# Patient Record
Sex: Female | Born: 1990 | Race: White | Hispanic: No | Marital: Married | Smoking: Current every day smoker
Health system: Southern US, Community
[De-identification: ages and names within clinical notes are randomized; demographics above are authoritative.]

## PROBLEM LIST (undated history)

## (undated) DIAGNOSIS — D649 Anemia, unspecified: Secondary | ICD-10-CM

## (undated) DIAGNOSIS — F1910 Other psychoactive substance abuse, uncomplicated: Secondary | ICD-10-CM

## (undated) DIAGNOSIS — K219 Gastro-esophageal reflux disease without esophagitis: Secondary | ICD-10-CM

## (undated) HISTORY — PX: MOUTH SURGERY: SHX715

## (undated) HISTORY — PX: WISDOM TOOTH EXTRACTION: SHX21

---
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
## 2011-02-22 NOTE — Discharge Summary (Signed)
NAMESUZANN, Crystal Huang              ACCOUNT NO.:  [id number]   MEDICAL RECORD NO.:  [id number]          PATIENT TYPE:  INP   LOCATION:  9115                          [facility]   PHYSICIAN:  Hal Morales, M.D.DATE OF BIRTH:  24-Sep-1991   DATE OF ADMISSION:  09/22/2007  DATE OF DISCHARGE:  09/27/2007                               DISCHARGE SUMMARY   ADDENDUM:  She was deemed to have received the full benefit of her  hospital stay and was discharged home.   DISCHARGE MEDICATIONS:  1. Motrin 600 mg p.o. q.6 h p.r.n.  2. Procardia XL 60 mg one p.o. daily.   DISCHARGE LABS:  White blood cell count 13.8, hemoglobin 11.4, platelets  145.  Sodium 132, potassium 3.6, creatinine 0.68, AST 24, ALT 12.  Uric  acid 5.3.   DISCHARGE INSTRUCTIONS:  Per [facility] handout.   DISCHARGE FOLLOWUP:  Will occur with the home health nurse visiting next  week and again the following week and then in 6 weeks at our office or  p.r.n. as indicated.  Condition at discharge was good.      Marie L. Williams, C.N.M.      Hal Morales, M.D.  Electronically Signed    MLW/MEDQ  D:  09/27/2007  T:  09/28/2007  Job:  [id number]

## 2011-02-22 NOTE — Discharge Summary (Signed)
Huang, Crystal              ACCOUNT NO.:  [id number]   MEDICAL RECORD NO.:  [id number]          PATIENT TYPE:  INP   LOCATION:  9115                          [facility]   PHYSICIAN:  Hal Morales, M.D.DATE OF BIRTH:  Sep 14, 1991   DATE OF ADMISSION:  09/22/2007  DATE OF DISCHARGE:  09/27/2007                               DISCHARGE SUMMARY   ADMITTING PHYSICIAN:  Dois Davenport A. Rivard, M.D.   DISCHARGING PHYSICIAN:  Hal Morales, M.D.   ADMITTING DIAGNOSES:  Intrauterine pregnancy at 38-2/7 weeks and  preeclampsia.   DISCHARGE DIAGNOSES:  Intrauterine pregnancy at 38-2/7 weeks and  preeclampsia with the addition of status post spontaneous vaginal birth  of a female named Cayden.  Apgars 9 and 9, weighing 5 pounds 11 ounces  with bilateral vulvar lacerations.  Vaginal septum.   HOSPITAL PROCEDURES:  1. Electronic fetal monitoring.  2. Magnesium sulfate administration.  3. Induction of labor with Pitocin.  4. Spontaneous vaginal delivery.  5. Repair of vulvar lacerations.  6. Repair of vaginal septum.   HOSPITAL COURSE:  The patient was admitted with severe headaches and  hypertension.  Blood pressures were 156-184/112-113.  Uric acid was 5.6.  Urine showed greater than 300 mg of protein and platelets were 145.  Magnesium sulfate was initiated and labetalol was given for blood  pressure control.  The patient was begun on Pitocin for induction of  labor and progressed steadily throughout the day.  She was noted at the  time that she was beginning to push that she had a low vaginal septum  noted and the septum was removed and repaired.  She had a spontaneous  vaginal delivery of a female named Cayden.  Apgars 9 and 9, weighing 5  pounds 11 ounces.  Bilateral vulvar lacerations were repaired.  She was  taken to [facility] and given routine postpartum care.  Blood pressures ranged  122-155/77-100.  Labs were normal.  Magnesium sulfate was discontinued  on the second day.  Her  medication was changed to Procardia for better  blood pressure control.  She was transferred from AICU out to the floor.  Blood pressures ranged 120's to 150's over 70's to 90's.  On postpartum  day #3 her Procardia was increased to 60 mg daily.  Blood pressures then  improved to 130's over 80's to 90's.  On postpartum day #4 she was doing  well, ready to go home.  She was having no headaches or symptoms.  She  was breast-feeding well.  Blood pressures were  130-146/87-90.  Chest was clear.  Heart rate regular rate and rhythm.  Fundus firm.  Lochia small.  Extremities were without edema.  DTRs were  2+.  Perineum was healing.  She was deemed to have received full benefit  of her hospital stay and was discharged home.      Marie L. Williams, C.N.M.      Hal Morales, M.D.  Electronically Signed    MLW/MEDQ  D:  09/27/2007  T:  09/28/2007  Job:  [id number]

## 2011-02-22 NOTE — H&P (Signed)
NAMEARISSA, Crystal Huang              ACCOUNT NO.:  [id number]   MEDICAL RECORD NO.:  [id number]          PATIENT TYPE:  INP   LOCATION:  9373                          [facility]   PHYSICIAN:  Dois Davenport A. Rivard, M.D. DATE OF BIRTH:  24-May-1991   DATE OF ADMISSION:  09/22/2007  DATE OF DISCHARGE:                              HISTORY & PHYSICAL   HISTORY OF PRESENT ILLNESS:  Patient is a 20 year old single white  female gravida 2, para 0-0-1-0 who presents to maternity admissions at  38 weeks and 2 days with complaints of severe headache over the past 24  hours.  Patient reports her headache had been unrelieved by Tylox which  caused nausea and vomiting.  Patient denies any vision changes or right  upper quadrant pain.  Patient states that she feels that her face is  swollen but her edema in her lower extremities has remained unchanged.  Patient reports her fetus is moving normally and that she does have  occasional irregular uterine contractions, however, she denies rupture  of membranes or bleeding at the present time.   The patient's pregnancy has been remarkable for  1. Unsure LMP.  2. Tape sensitivity.  3. Toxo-risk.  4. Tobacco use which has ceased.  5. History of abnormal Pap.  6. Adolescent pregnancy.   Patient's pregnancy has been followed by the C.N.M. service of [facility].   ALLERGIES:  No medical allergies.  Patient is allergic to TAPE ADHESIVE.   GYNECOLOGICAL HISTORY:  Patient reports menarche at age 71 with 28-day  cycles and mild dysmenorrhea.  Patient with a history of abnormal Pap in  the past in 2007 which was to be repeated.  Patient's history is  remarkable for OCP use in the past and condoms most recently.  Patient  denies any history of STDs.   OBSTETRICAL HISTORY:  1. Pregnancy #10 May 2006, spontaneous abortion.  2. Pregnancy #2 is current.  Patient's LMP December 28, 2006.  [facility]      October 04, 2007, established by ultrasound.   PAST  MEDICAL HISTORY:  Significant for anemia resolved with iron,  otherwise negative.   PAST SURGICAL HISTORY:  Negative.   HOSPITALIZATION:  Significant for overdose of Tylenol which was not a  suicide attempt.   FAMILY HISTORY:  Patient's mother with heart disease, hypertension,  anemia, kidney disease.  Maternal grandfather with heart disease.  Maternal grandfather, hypertension.  Maternal grandmother, hypertension  as well as anemia and stroke.  Father with diabetes.  Father with  rheumatoid arthritis.  Paternal grandfather, lung cancer.  Grandmother  with DVT.  Father of the baby's parents are distant cousins.  Otherwise  family history is negative.   PRENATAL LABORATORY DATA:  Initial hemoglobin 12.3, hematocrit 37.4,  platelets 232,000.  Hepatitis B negative.  Rubella immune.  RPR  nonreactive.  Blood type O positive.  Antibody screen negative.  HIV  nonreactive.  Cystic fibrosis negative.  Glucola at 28 weeks 131 and RPR  nonreactive.  Group B Strep culture at 36 weeks negative.  Gonorrhea and  Chlamydia cultures negative.  Pap at  new OB negative.  Gonorrhea and  Chlamydia negative at new OB.   HISTORY OF PRESENT PREGNANCY:  Patient entered care at [redacted] weeks  gestation.  Patient treated with Phenergan for nausea and vomiting of  pregnancy.  Patient with nuchal thickness of 1.29 cm.  Nasal bone  present.  First trimester screen was negative.  Urine culture with E.  coli at the time of new OB which was treated with Macrobid.  Ultrasound  at 19 weeks consistent with dates.  Placenta anterior.  Cervix 4 cm,  normal fluid and declined maternal serum AFP.  Repeat culture for  __________  for UTI at 23 weeks revealed persistent E. coli which was  treated with Keflex.  Hemoglobin 9.8 at 26 weeks with above Glucola  results.  With no UTI signs and symptoms.  Patient was started on iron  at 26 weeks.  Patient with low back pain at 32 weeks.  Patient's urine  sent due to frequent UTIs.   Urine culture at 32 weeks returned with E.  coli again and patient was treated with Keflex.  Ultrasound size less  than dates at 35 weeks which revealed growth at the 72 percentile and  normal amniotic fluid index.  GBS, GC and Chlamydia were all obtained at  that time.  Cervix was also evaluated and was closed 50% at that time.  At 36 weeks, patient with complaint of headache not relieved by Tylenol  or Midrin unless given a prescription for Tylox for headaches.  At that  time, patient's blood pressure was 110/80 and trace of protein and  urine.  Patient also has been treated for a UTI and had a urine culture  showing E. coli on September 12, 2007, and was changed to a cephalosporin  at that time.   OBJECTIVE:  VITAL SIGNS:  Blood pressures on admission 175/115, 168/110,  163/113, 158/111.  Patient is afebrile, temperature 98.4 degrees.  SKIN:  Warm and dry.  Color satisfactory.  GENERAL APPEARANCE:  Patient is in no apparent distress but does appear  to be uncomfortable with headache.  HEENT:  Pupils are equal, round, reactive to light and accommodation.  CHEST:  Clear to auscultation and percussion.  CARDIOVASCULAR:  Regular rate and rhythm.  BREASTS:  Soft.  ABDOMEN:  Soft, gravid and nontender.  Fetus is vertex to Leopold's  maneuvers.  Fundal height consistent with 37 cm.  Fetal heart rate  baseline 110s-120s with variability and accelerations present.  Overall  reassuring fetal heart rate.  Occasional irregular uterine contraction  noted on fetal heart rate monitor.  STERILE VAGINAL:  Exam reveals cervix to be 1 cm dilated, 80% effaced, -  3 station, vertex presentation.  EXTREMITIES:  Trace edema noted in the lower extremities.  NEUROLOGIC:  Deep tendon reflexes 2+ with one beat of clonus on the  left, no clonus on the right.   ADMISSION LABORATORY DATA:  Platelet count 145,000, hemoglobin 12.3,  hematocrit 34.3.  Sodium 134, potassium 3.7.  SGOT 18, SGPT 13.  Uric  acid is  5.6.  Urine was greater than 300 mg/dl of protein on UA,  otherwise a negative urinalysis.   ASSESSMENT:  1. Intrauterine pregnancy at 38-2/7 weeks.  2. Preeclampsia.   PLAN:  1. Consult obtained with Dr. Estanislado Pandy.  Patient to be admitted to      birthing suite.  2. Care of patient to be transferred to the MD service at [facility].  3. Patient  to be given IV labetalol to decrease blood pressure in MAU      prior to transfer to birthing suite as well as started on magnesium      prophylaxis for preeclampsia.  Findings and diagnosis were      discussed with the patient and father of the baby.  Labor will be      induced with Cytotec beginning tonight per Dr. Estanislado Pandy.      Rhona Leavens, CNM      Crist Fat Rivard, M.D.  Electronically Signed    NOS/MEDQ  D:  09/23/2007  T:  09/24/2007  Job:  [id number]
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
## 2011-07-15 LAB — COMPREHENSIVE METABOLIC PANEL
Albumin: 2 — ABNORMAL LOW
Alkaline Phosphatase: 139 — ABNORMAL HIGH
BUN: 3 — ABNORMAL LOW
CO2: 25
Chloride: 106
GFR calc non Af Amer: >60
Glucose, Bld: 121 — ABNORMAL HIGH
Potassium: 4
Total Bilirubin: 0.2 — ABNORMAL LOW

## 2011-07-15 LAB — CBC
HCT: 27.8 — ABNORMAL LOW
Hemoglobin: 9.6 — ABNORMAL LOW
Platelets: 134 — ABNORMAL LOW
RBC: 3.13 — ABNORMAL LOW
WBC: 13.5 — ABNORMAL HIGH

## 2011-07-15 LAB — MAGNESIUM: Magnesium: 5.5 — ABNORMAL HIGH

## 2011-07-18 LAB — URINALYSIS, ROUTINE W REFLEX MICROSCOPIC
Leukocytes, UA: NEGATIVE
Nitrite: NEGATIVE
Protein, ur: >300 — AB
Urobilinogen, UA: 0.2

## 2011-07-18 LAB — COMPREHENSIVE METABOLIC PANEL
ALT: 15
AST: 18
AST: 24
AST: 27
Albumin: 2.5 — ABNORMAL LOW
Alkaline Phosphatase: 176 — ABNORMAL HIGH
BUN: 4 — ABNORMAL LOW
CO2: 21
CO2: 22
Calcium: 8.4
Chloride: 102
Creatinine, Ser: 0.67
Creatinine, Ser: 0.68
GFR calc Af Amer: >60
GFR calc Af Amer: >60
GFR calc Af Amer: >60
GFR calc non Af Amer: >60
GFR calc non Af Amer: >60
Glucose, Bld: 102 — ABNORMAL HIGH
Glucose, Bld: 110 — ABNORMAL HIGH
Potassium: 4.1
Sodium: 134 — ABNORMAL LOW
Total Bilirubin: 0.4
Total Protein: 5.6 — ABNORMAL LOW

## 2011-07-18 LAB — CBC
HCT: 32.7 — ABNORMAL LOW
Hemoglobin: 11.4 — ABNORMAL LOW
MCHC: 35.9
MCV: 85.5
MCV: 88.3
Platelets: 143 — ABNORMAL LOW
RBC: 4.01
RDW: 12.6
RDW: 12.7

## 2011-07-18 LAB — LACTATE DEHYDROGENASE
LDH: 140
LDH: 177

## 2011-07-18 LAB — URINE MICROSCOPIC-ADD ON

## 2013-02-21 ENCOUNTER — Other Ambulatory Visit: Payer: Self-pay

## 2013-02-26 ENCOUNTER — Other Ambulatory Visit (HOSPITAL_COMMUNITY): Payer: Self-pay | Admitting: Physician Assistant

## 2013-02-26 ENCOUNTER — Other Ambulatory Visit: Payer: Self-pay

## 2013-02-26 DIAGNOSIS — Z3689 Encounter for other specified antenatal screening: Secondary | ICD-10-CM

## 2013-02-26 DIAGNOSIS — O2692 Pregnancy related conditions, unspecified, second trimester: Secondary | ICD-10-CM

## 2013-02-27 ENCOUNTER — Encounter (HOSPITAL_COMMUNITY): Payer: Self-pay | Admitting: Physician Assistant

## 2013-03-06 ENCOUNTER — Ambulatory Visit (HOSPITAL_COMMUNITY)
Admission: RE | Admit: 2013-03-06 | Discharge: 2013-03-06 | Disposition: A | Discharge status: Home / Self Care | Payer: Medicaid Other | Source: Ambulatory Visit | Attending: Physician Assistant | Admitting: Physician Assistant

## 2013-03-06 ENCOUNTER — Encounter (HOSPITAL_COMMUNITY): Payer: Self-pay

## 2013-03-06 DIAGNOSIS — Z3689 Encounter for other specified antenatal screening: Secondary | ICD-10-CM

## 2013-03-06 DIAGNOSIS — F1120 Opioid dependence, uncomplicated: Secondary | ICD-10-CM | POA: Insufficient documentation

## 2013-03-06 DIAGNOSIS — Z1389 Encounter for screening for other disorder: Secondary | ICD-10-CM | POA: Insufficient documentation

## 2013-03-06 DIAGNOSIS — Z363 Encounter for antenatal screening for malformations: Secondary | ICD-10-CM | POA: Insufficient documentation

## 2013-03-06 DIAGNOSIS — O289 Unspecified abnormal findings on antenatal screening of mother: Secondary | ICD-10-CM | POA: Insufficient documentation

## 2013-03-06 DIAGNOSIS — F1920 Other psychoactive substance dependence, uncomplicated: Secondary | ICD-10-CM | POA: Insufficient documentation

## 2013-03-06 DIAGNOSIS — O2692 Pregnancy related conditions, unspecified, second trimester: Secondary | ICD-10-CM

## 2013-03-06 DIAGNOSIS — O99330 Smoking (tobacco) complicating pregnancy, unspecified trimester: Secondary | ICD-10-CM | POA: Insufficient documentation

## 2013-03-06 DIAGNOSIS — O358XX0 Maternal care for other (suspected) fetal abnormality and damage, not applicable or unspecified: Secondary | ICD-10-CM | POA: Insufficient documentation

## 2013-03-06 IMAGING — US US OB DETAIL+14 WK
1 series · 15 of 28 positions shown · non-contrast
Comparison: none

OBSTETRICS REPORT
                      (Signed Final 03/06/2013 [DATE])

Service(s) Provided
 US OB DETAIL + 14 WK                                  76811.0
Indications
 Detailed fetal anatomic survey
 Abnormal biochemical screen (quad) for Trisomy
 21 ([DATE])
 Suboxone use (8mg BID)
 Cigarette smoker
Fetal Evaluation
 Num Of Fetuses:    1
 Fetal Heart Rate:  138                          bpm
 Cardiac Activity:  Observed
 Presentation:      Transverse, head to
                    maternal right
 Placenta:          Anterior, above cervical os
 P. Cord            Visualized, central
 Insertion:
 Amniotic Fluid
 AFI FV:      Subjectively within normal limits
                                             Larg Pckt:     5.9  cm
Biometry
 BPD:     43.6  mm     G. Age:  19w 1d                CI:         84.5   70 - 86
 OFD:     51.6  mm                                    FL/HC:      15.9   15.8 -
                                                                         18
 HC:     154.2  mm     G. Age:  18w 3d       47  %    HC/AC:      1.22   1.07 -
 AC:       126  mm     G. Age:  18w 1d       44  %    FL/BPD:
 FL:      24.5  mm     G. Age:  17w 3d       16  %    FL/AC:      19.4   20 - 24
 HUM:     25.3  mm     G. Age:  18w 0d       44  %
 CER:       19  mm     G. Age:  18w 4d       57  %
 NFT:      4.5  mm
 Est. FW:     215  gm      0 lb 8 oz     39  %
Gestational Age
 LMP:           17w 2d        Date:  11/05/12                 EDD:   08/12/13
 U/S Today:     18w 2d                                        EDD:   08/05/13
 Best:          18w 2d     Det. By:  Early Ultrasound         EDD:   08/05/13
                                     (01/24/13)
Anatomy
 Cranium:          Appears normal         Aortic Arch:      Not well visualized
 Fetal Cavum:      Not well visualized    Ductal Arch:      Not well visualized
 Ventricles:       Appears normal         Diaphragm:        Appears normal
 Choroid Plexus:   Appears normal         Stomach:          Appears normal, left
                                                            sided
 Cerebellum:       Not well visualized    Abdomen:          Appears normal
 Posterior Fossa:  Not well visualized    Abdominal Wall:   Appears nml (cord
                                                            insert, abd wall)
 Nuchal Fold:      Not well visualized    Cord Vessels:     Appears normal (3
                                                            vessel cord)
 Face:             Appears normal         Kidneys:          Appear normal
                   (orbits and profile)
 Lips:             Appears normal         Bladder:          Appears normal
 Palate:           Not well visualized    Spine:            Not well visualized
 Heart:            Not well visualized    Lower             Appears normal
                                          Extremities:
 RVOT:             Not well visualized    Upper             Appears normal
 LVOT:             Not well visualized
 Other:  Fetus appears to be a male. Heels and 5th digit visualized. Nasal
         bone visualized. Technically difficult due to fetal position.
Targeted Anatomy
 Fetal Central Nervous System
 Cisterna Magna:
Cervix Uterus Adnexa
 Cervical Length:    3        cm
 Cervix:       Normal appearance by transabdominal scan.
 Left Ovary:    Within normal limits.
 Right Ovary:   Not visualized.
 Adnexa:     No abnormality visualized.
Impression
INDICATION: 23/22 yr old [id number] at 31w8d with abnormal
 quad screen with elevated inhibin of F.L33o3 and risk of
 trisomy 2[REDACTED] for fetal anatomic survey. Patient with
 opiate addiction on suboxone. Pateint reports history of
 preeclampsia.

[Series 1: us ob detail+14 wk · 0.19mm/px · 15 of 82 slices shown]
[im 1/82]
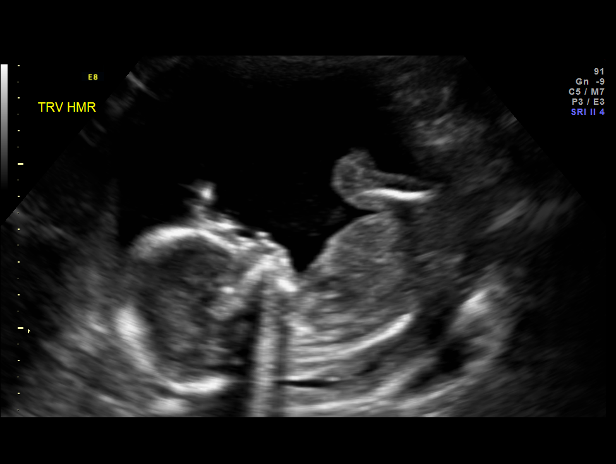
[im 7/82]
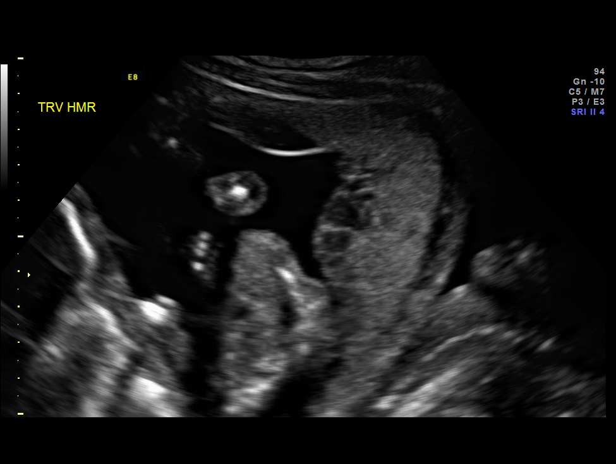
[im 13/82]
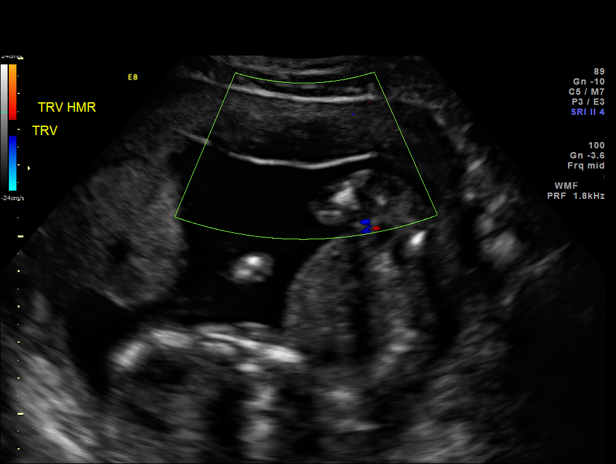
[im 19/82]
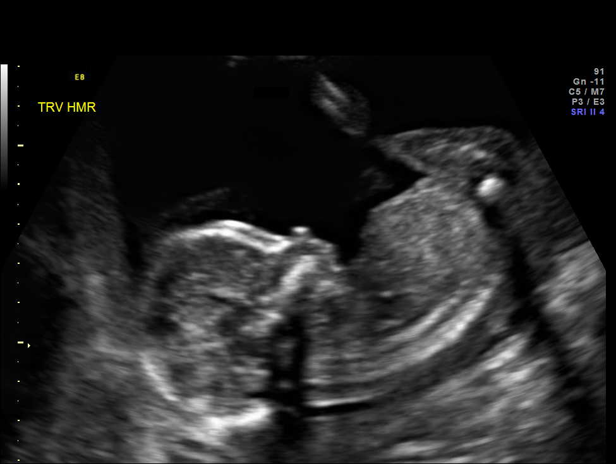
[im 25/82]
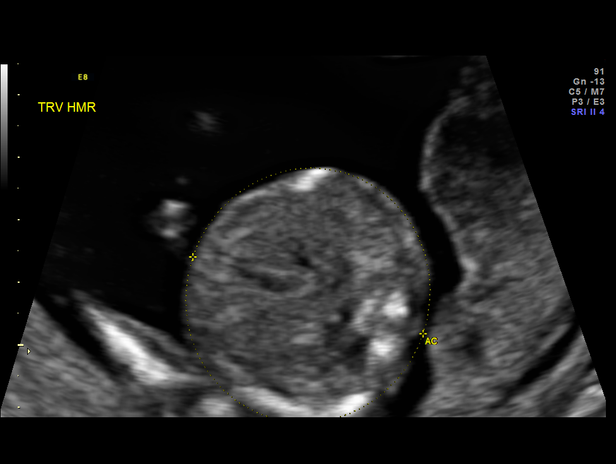
[im 31/82]
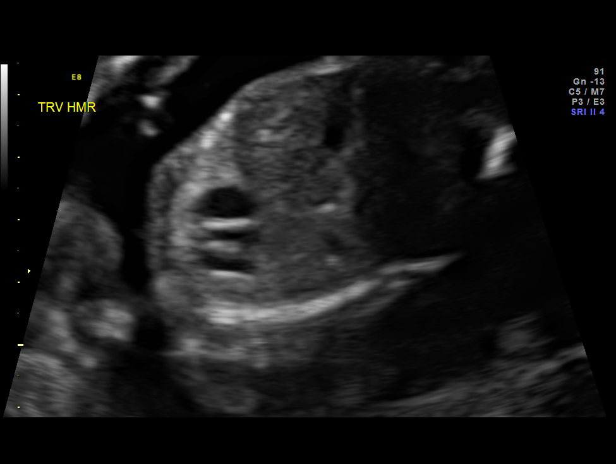
[im 37/82]
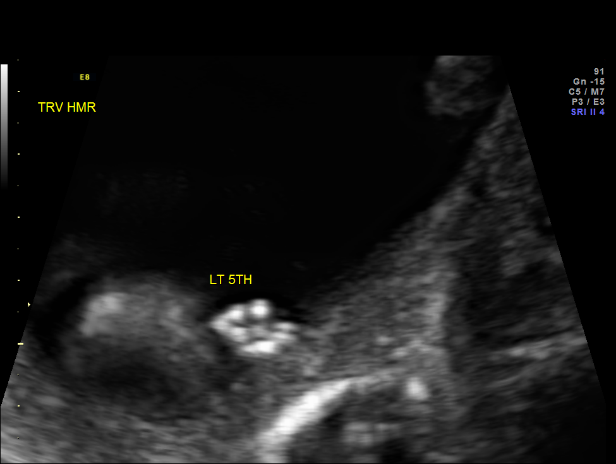
[im 43/82]
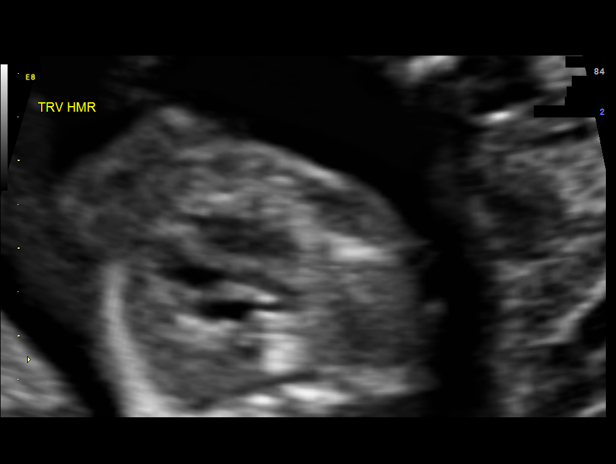
[im 46/82]
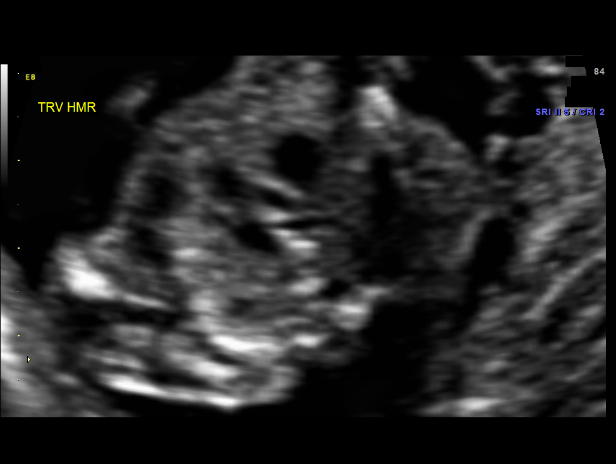
[im 52/82]
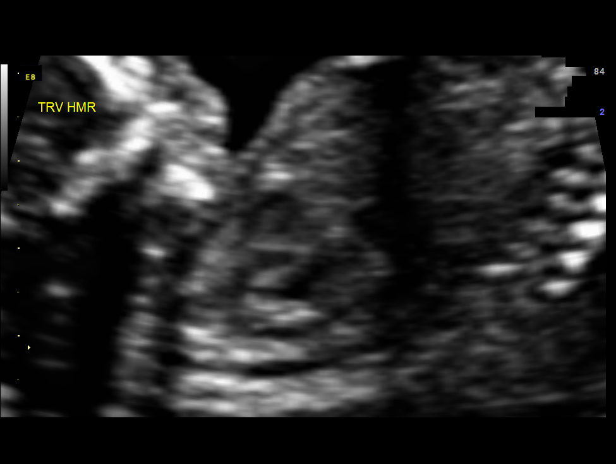
[im 58/82]
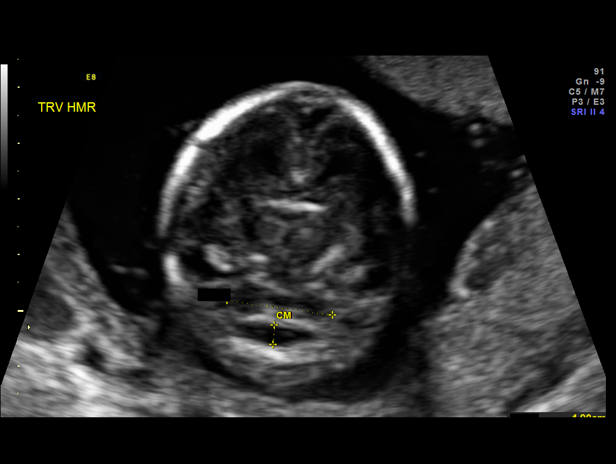
[im 64/82]
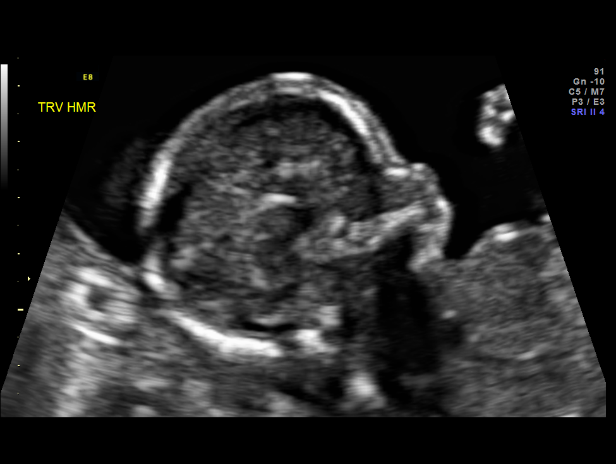
[im 70/82]
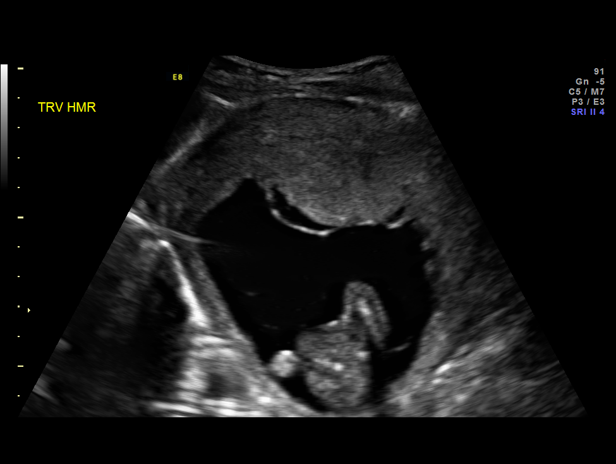
[im 76/82]
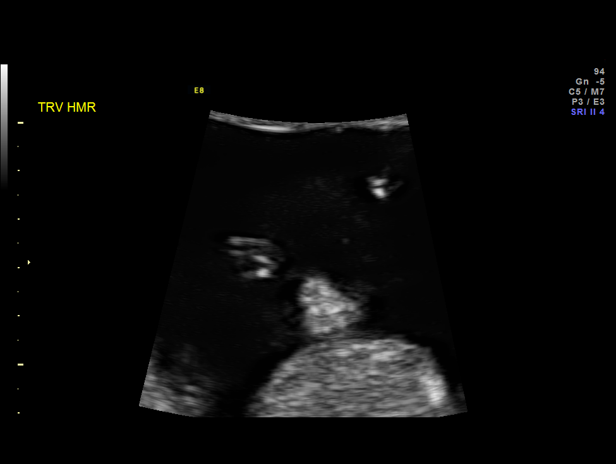
[im 82/82]
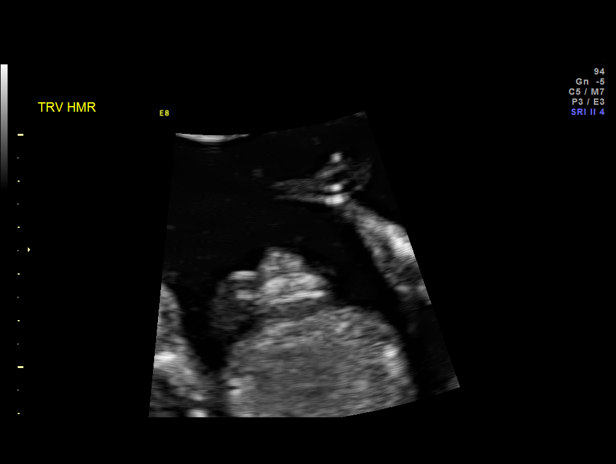

[15 of 28 positions shown; findings below may reference images not displayed]

FINDINGS: 1. Single intrauterine pregnancy.
 2. Fetal biometry is consistent with dating.
 3. Anterior placenta without evidence of previa.
 4. Normal amniotic fluid volume.
 5. Normal transabdominal cervical length.
 6. The views of the cavum, posterior fossa, nuchal fold,
 palate, heart, and spine are limited.
 7. The remainder of the limited anatomy survey is normal.
Recommendations

 1. Appropriate fetal growth.
 2. Limited anatomy survey:
 - recommend follow up in 2-3 weeks to complete anatomic
 survey
 3. Abnormal quad screen:
 - patient met with genetic counselor; see separate report
 - cell free fetal DNA pending
 4. Elevated inhibin:
 I discussed the association with fetal aneuploidy. I also
 discussed the association with increased risk of adverse
 pregnancy outcomes including increased risk of fetal growth
 restriction, preeclampsia, stillbirth, preterm birth, and
 abruption. I discussed these risks are only slightly increased
 over baseline but heightened surveillance is warranted. I
 recommend serial fetal growth ultrasounds. I recommend
 antenatal testing only in the setting of fetal growth restriction.
 I recommend close surveillance for the development of
 signs/symptoms of preterm labor, PPROM, and
 preeclampsia.
 5. Suboxone use:
 - no consult requested
 - recommend fetal growth every 4 weeks; given increased
 risk of fetal growth restriction
 - recommend NICU consult in the third trimester to discuss
 risk of neonatal abstinence syndrome
 6. History of preeclampsia:
 - recommend close surveillance for the development of
 signs/symptoms of preeclampsia
 - discussed increased risk of recurrence

 questions or concerns.

## 2013-03-06 NOTE — Progress Notes (Signed)
[facility] ultrasound  Indication: 23/22 yr old [id number] at [redacted]w[redacted]d with abnormal quad screen with elevated inhibin of 3. and risk of trisomy 103 of 1:117 for fetal anatomic survey. Patient with opiate addiction on suboxone. Pateint reports history of preeclampsia.  Findings: 1. Single intrauterine pregnancy. 2. Fetal biometry is consistent with dating. 3. Anterior placenta without evidence of previa. 4. Normal amniotic fluid volume. 5. Normal transabdominal cervical length. 6. The views of the cavum, posterior fossa, nuchal fold, palate, heart, and spine are limited. 7. The remainder of the limited anatomy survey is normal.  Recommendations: 1. Appropriate fetal growth. 2. Limited anatomy survey: - recommend follow up in 2-3 weeks to complete anatomic survey 3. Abnormal quad screen: - patient met with genetic counselor; see separate report - cell free fetal DNA pending 4. Elevated inhibin: I discussed the association with fetal aneuploidy. I also discussed the association with increased risk of adverse pregnancy outcomes including increased risk of fetal growth restriction, preeclampsia, stillbirth, preterm birth, and abruption. I discussed these risks are only slightly increased over baseline but heightened surveillance is warranted. I recommend serial fetal growth ultrasounds. I recommend antenatal testing only in the setting of fetal growth restriction. I recommend close surveillance for the development of signs/symptoms of preterm labor, PPROM, and preeclampsia.  5. Suboxone use: - no consult requested - recommend fetal growth every 4 weeks; given increased risk of fetal growth restriction - recommend NICU consult in the third trimester to discuss risk of neonatal abstinence syndrome 6. History of preeclampsia: - recommend close surveillance for the development of signs/symptoms of preeclampsia - discussed increased risk of recurrence  Eulis Foster, MD

## 2013-03-06 NOTE — Progress Notes (Addendum)
Genetic Counseling  High-Risk Gestation Note  Appointment Date:  03/06/2013 Referred By: Duncan Dull, PA-C Date of Birth:  1990-10-17 Partner: Marcial Pacas "Cindee Lame" Mckeone   Pregnancy History: [id number] Estimated Date of Delivery: 08/05/13 Estimated Gestational Age: [redacted]w[redacted]d Attending: Eulis Foster, MD   Ms. Julio Sicks and her partner, Mr. Tim "[employer], were seen for genetic counseling because of an increased risk for fetal Down syndrome based on maternal serum Quad screening through [facility].  They were counseled regarding the Quad screen result and the associated 1 in 117 risk for fetal Down syndrome.  We reviewed chromosomes, nondisjunction, and the common features and variable prognosis of Down syndrome.  In addition, we reviewed the screen adjusted reduction in risks for trisomy 18 and ONTDs.  We also discussed other explanations for a screen positive result including: a gestational dating error, differences in maternal metabolism, and normal variation. They understand that this screening is not diagnostic for Down syndrome but provides a risk assessment.  We reviewed available screening options including noninvasive prenatal screening (NIPS)/cell free fetal DNA (cffDNA) testing, and detailed ultrasound.  They were counseled that screening tests are used to modify a patient's a priori risk for aneuploidy, typically based on age. This estimate provides a pregnancy specific risk assessment. We reviewed the benefits and limitations of each option. Specifically, we discussed the conditions for which each test screens, the detection rates, and false positive rates of each.  They were also counseled regarding diagnostic testing via amniocentesis.  We reviewed the approximate 1 in 300-500 risk for complications for amniocentesis, including spontaneous pregnancy loss.  Ms. Yetta Flock reported that she had [facility] screening drawn through her primary obstetrician's office on Feb 26, 2013.  We reviewed that  this is a brand name for NIPS/cffDNA.  After reviewing this information, they elected to proceed with a detailed anatomy ultrasound and await the results of her NIPS/cffDNA test.  A complete ultrasound was performed today. The ultrasound report will be documented separately. There were no visualized fetal anomalies or markers suggestive of aneuploidy.  Some views were limited because of fetal positioning.  They understand that screening tests cannot rule out all birth defects or genetic syndromes. The patient was advised of this limitation and states she still does not want additional testing at this time.   Ms. Yetta Flock was provided with written information regarding cystic fibrosis (CF) including the carrier frequency and incidence in the Caucasian population, the availability of carrier testing and prenatal diagnosis if indicated.  In addition, we discussed that CF is routinely screened for as part of the [place] newborn screening panel.  She declined testing today.   Both family histories were reviewed and found to be contributory for the FOB, Mr. Corriveau, having significant ophthalmologic concerns.  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  He reported that he has never had good vision in his left eye, but was unclear about the specific etiology of his retinal difference.  He stated that his vision is reduced in his right eye as well, but the retina is normal.  Mr. Plake also reported that his father and his paternal grandfather have significant myopia, but neither have retinal detachments.  Mr. Adelstein does not have other medical concerns or congenital anomalies.  We discussed that retinal detachments can be genetic, environmental, or multifactorial in etiology.  Specifically, we discussed that retinal detachments can occur as a feature of an underlying genetic condition, specifically, connective tissue disorders.  He has never been evaluated by a  medical geneticist and has no other specific features  suggestive of a genetic condition.  We discussed that the risk of recurrence depends on the underlying etiology.    Additionally, Mr. Apt reported that his maternal second cousin has "some problems" of unknown etiology.  He is not clear what concerns this relative has, but thought that the relative may have some form of muscular dystrophy.  He was encouraged to discuss this history with his family and contact me with additional information.  We discussed that muscular dystrophy is a broad term describing multiple conditions, which can be inherited in a variety of ways.  Similarly, Ms. Yetta Flock reported that her maternal uncle was born with "multiple issues."  She was encouraged to contact me if she is able to learn more about this relative and his problems.  Without further information regarding the provided family history, an accurate genetic risk cannot be calculated. Further genetic counseling is warranted if more information is obtained.  Ms. Yetta Flock denied exposure to environmental toxins or chemical agents. She denied the use of alcohol and street drugs.  She reported that she smokes 1/2 ppd.  We discussed the implications and counseled regarding cessation.  She denied significant viral illnesses during the course of her pregnancy. Her medical and surgical histories were contributory for narcotic abuse and current use of suboxone.   I counseled this couple for approximately 44 minutes regarding the above risks and available options.     Donald Prose, MS Certified Genetic Counselor

## 2013-03-28 ENCOUNTER — Ambulatory Visit (HOSPITAL_COMMUNITY)
Admission: RE | Admit: 2013-03-28 | Discharge: 2013-03-28 | Disposition: A | Discharge status: Home / Self Care | Payer: Medicaid Other | Source: Ambulatory Visit | Attending: Physician Assistant | Admitting: Physician Assistant

## 2013-03-28 DIAGNOSIS — Z3689 Encounter for other specified antenatal screening: Secondary | ICD-10-CM | POA: Insufficient documentation

## 2013-03-28 DIAGNOSIS — O99320 Drug use complicating pregnancy, unspecified trimester: Secondary | ICD-10-CM | POA: Insufficient documentation

## 2013-03-28 DIAGNOSIS — F1920 Other psychoactive substance dependence, uncomplicated: Secondary | ICD-10-CM | POA: Insufficient documentation

## 2013-03-28 DIAGNOSIS — O289 Unspecified abnormal findings on antenatal screening of mother: Secondary | ICD-10-CM | POA: Insufficient documentation

## 2013-03-28 DIAGNOSIS — O09299 Supervision of pregnancy with other poor reproductive or obstetric history, unspecified trimester: Secondary | ICD-10-CM | POA: Insufficient documentation

## 2013-03-28 DIAGNOSIS — F1120 Opioid dependence, uncomplicated: Secondary | ICD-10-CM | POA: Insufficient documentation

## 2013-03-28 DIAGNOSIS — Z1389 Encounter for screening for other disorder: Secondary | ICD-10-CM | POA: Insufficient documentation

## 2013-03-28 DIAGNOSIS — Z363 Encounter for antenatal screening for malformations: Secondary | ICD-10-CM | POA: Insufficient documentation

## 2013-03-28 DIAGNOSIS — O358XX0 Maternal care for other (suspected) fetal abnormality and damage, not applicable or unspecified: Secondary | ICD-10-CM | POA: Insufficient documentation

## 2013-03-28 DIAGNOSIS — O99330 Smoking (tobacco) complicating pregnancy, unspecified trimester: Secondary | ICD-10-CM | POA: Insufficient documentation

## 2013-03-28 IMAGING — US US OB FOLLOW-UP
1 series · 12 of 28 positions shown · non-contrast
Comparison: none

[Series 1: us ob follow-up · 0.16mm/px · 12 of 86 slices shown]
[im 4/86]
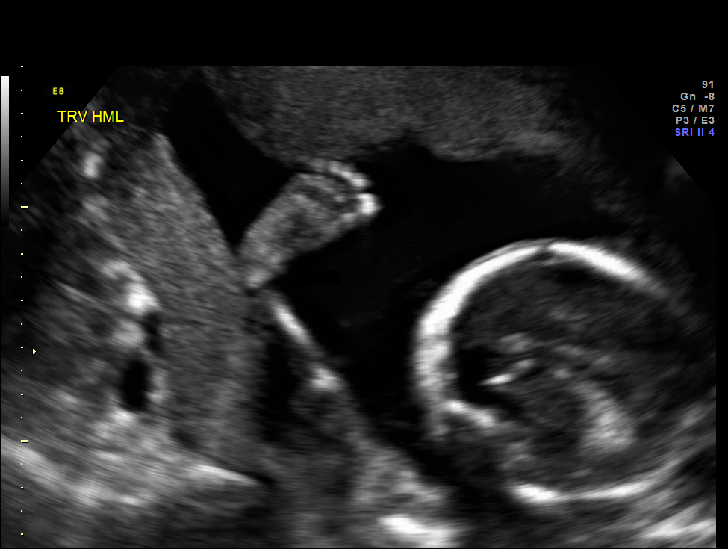
[im 10/86]
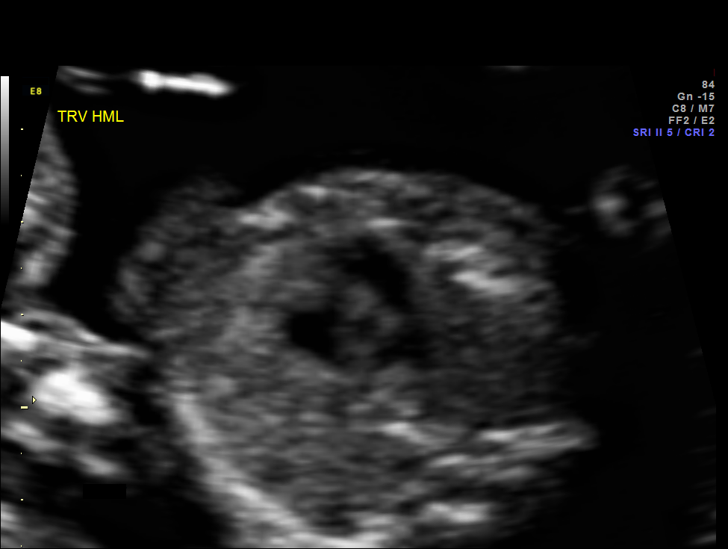
[im 16/86]
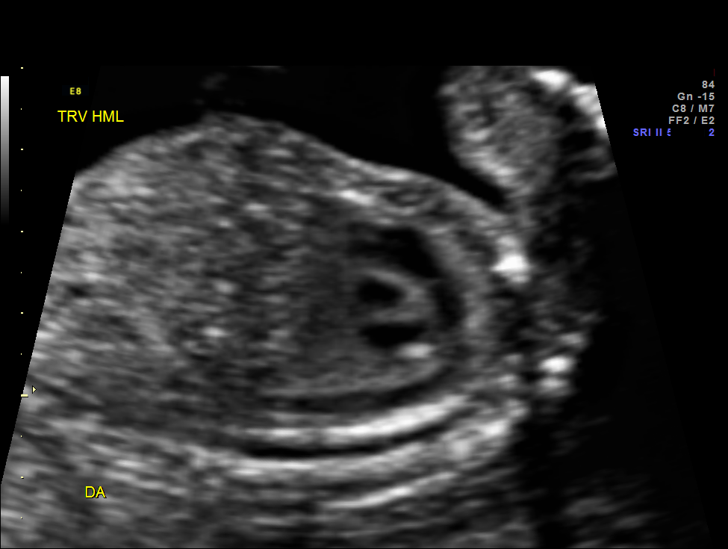
[im 26/86]
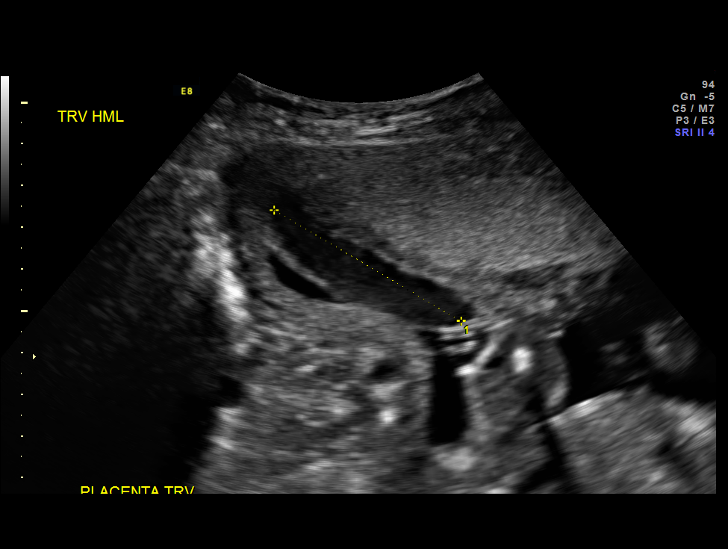
[im 32/86]
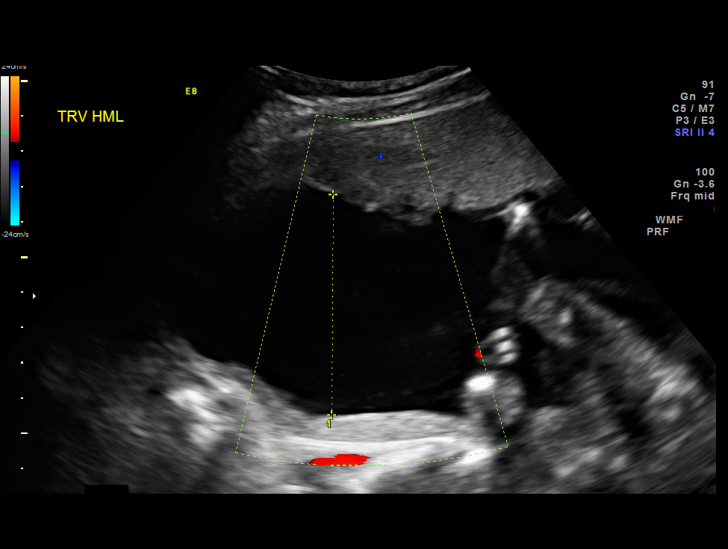
[im 38/86]
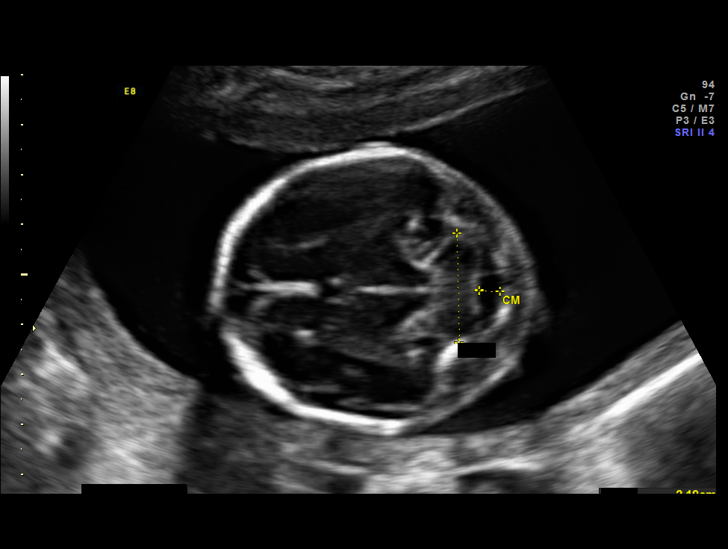
[im 48/86]
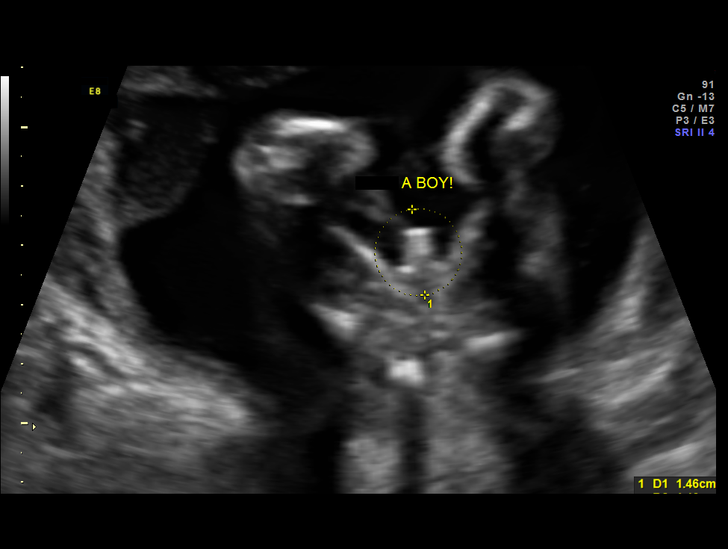
[im 54/86]
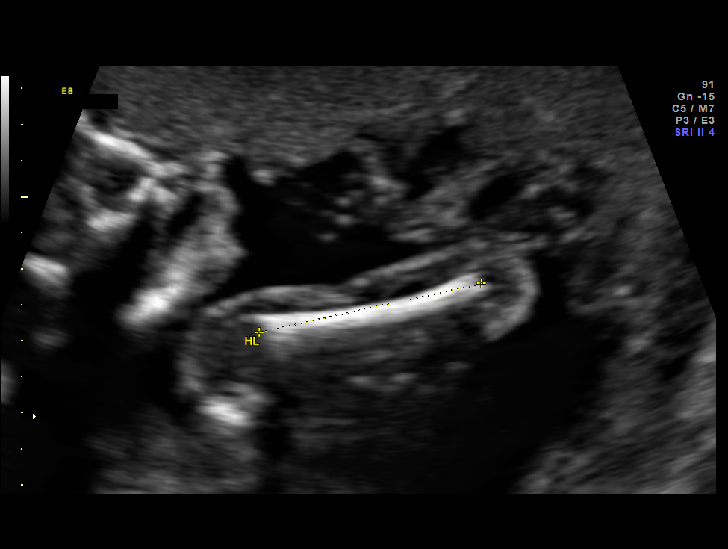
[im 60/86]
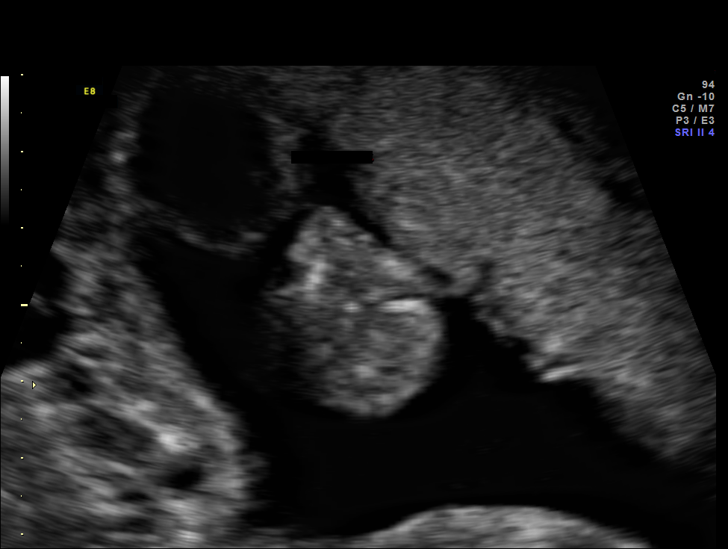
[im 70/86]
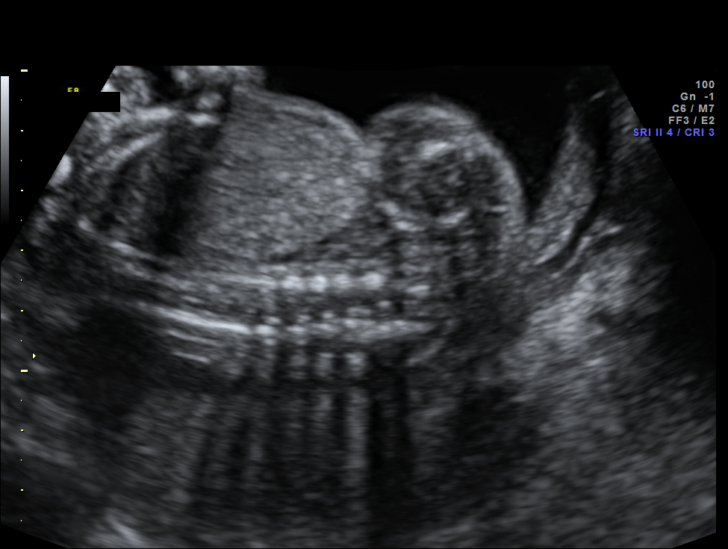
[im 76/86]
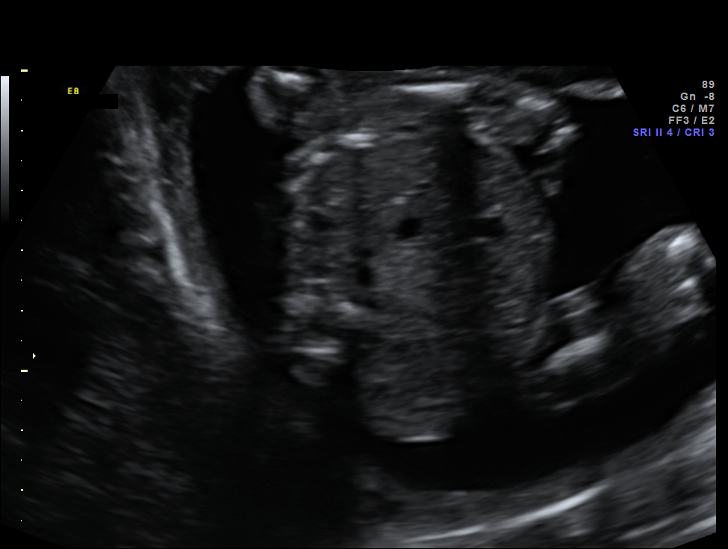
[im 82/86]
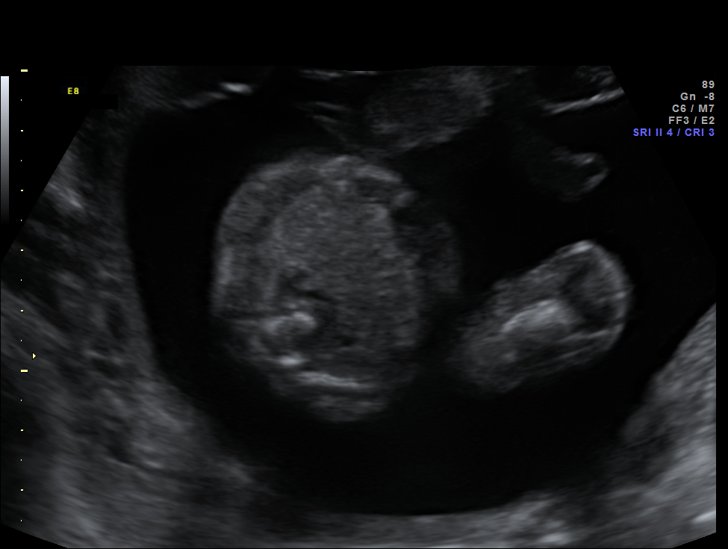

[12 of 28 positions shown; findings below may reference images not displayed]

OBSTETRICS REPORT
                      (Signed Final 03/28/2013 [DATE])

Service(s) Provided

 US OB FOLLOW UP                                       76816.1
Indications

 Elevated Inhibin (3.21 MoM)
 Abnormal biochemical screen (quad) for Trisomy
 21 ([DATE]) - low risk NIPS
 Suboxone use (8mg BID)
 Cigarette smoker
 Poor obstetric history: Previous preeclampsia
 Follow-up incomplete fetal anatomic evaluation
Fetal Evaluation

 Num Of Fetuses:    1
 Fetal Heart Rate:  135                          bpm
 Cardiac Activity:  Observed
 Presentation:      Variable
 Placenta:          Anterior, above cervical os
 P. Cord            Previously Visualized
 Insertion:

 Amniotic Fluid
 AFI FV:      Subjectively within normal limits
                                             Larg Pckt:     6.2  cm
Biometry

 BPD:     54.2  mm     G. Age:  22w 3d                CI:         85.1   70 - 86
 OFD:     63.7  mm                                    FL/HC:      16.9   15.9 -

 HC:     188.3  mm     G. Age:  21w 1d       27  %    HC/AC:      1.19   1.06 -

 AC:     158.8  mm     G. Age:  21w 0d       30  %    FL/BPD:
 FL:      31.8  mm     G. Age:  19w 6d        6  %    FL/AC:      20.0   20 - 24
 HUM:     31.7  mm     G. Age:  20w 4d       26  %
 CER:     21.9  mm     G. Age:  20w 6d       32  %

 Est. FW:     367  gm    0 lb 13 oz      26  %
Gestational Age

 LMP:           20w 3d        Date:  11/05/12                 EDD:   08/12/13
 U/S Today:     21w 1d                                        EDD:   08/07/13
 Best:          21w 3d     Det. By:  Early Ultrasound         EDD:   08/05/13
                                     (01/24/13)
Anatomy

 Cranium:          Appears normal         Aortic Arch:      Appears normal
 Fetal Cavum:      Appears normal         Ductal Arch:      Appears normal
 Ventricles:       Appears normal         Diaphragm:        Previously seen
 Choroid Plexus:   Appears normal         Stomach:          Appears normal, left
                                                            sided
 Cerebellum:       Appears normal         Abdomen:          Appears normal
 Posterior Fossa:  Appears normal         Abdominal Wall:   Previously seen
 Nuchal Fold:      Not applicable (>20    Cord Vessels:     Previously seen
                   wks GA)
 Face:             Orbits and profile     Kidneys:          Appear normal
                   previously seen
 Lips:             Appears normal         Bladder:          Appears normal
 Palate:           Appears normal         Spine:            Appears normal
 Heart:            Appears normal         Lower             Previously seen
                   (4CH, axis, and        Extremities:
                   situs)
 RVOT:             Appears normal         Upper             Previously seen
                                          Extremities:
 LVOT:             Appears normal

 Other:  Fetus appears to be a male. Heels and 5th digit previously visualized.
         Nasal bone previously visualized. Technically difficult due to fetal
         position.
Targeted Anatomy

 Fetal Central Nervous System
 Cisterna Magna:
Cervix Uterus Adnexa

 Cervical Length:    3        cm

 Cervix:       Normal appearance by transabdominal scan.

 Adnexa:     No abnormality visualized.
Impression

 IUP at 21+3 weeks
 Normal interval anatomy; anatomic survey complete
 Normal amniotic fluid volume
 Appropriate interval growth with EFW at the 26th %tile
Recommendations

 Serial ultrasounds for growth every 4-6 weeks

 questions or concerns.

## 2014-01-09 ENCOUNTER — Encounter (HOSPITAL_COMMUNITY): Payer: Self-pay | Admitting: *Deleted

## 2014-08-11 ENCOUNTER — Encounter (HOSPITAL_COMMUNITY): Payer: Self-pay | Admitting: *Deleted

## 2014-10-28 LAB — OB RESULTS CONSOLE RUBELLA ANTIBODY, IGM: RUBELLA: IMMUNE

## 2014-10-28 LAB — OB RESULTS CONSOLE RPR: RPR: NONREACTIVE

## 2014-10-28 LAB — OB RESULTS CONSOLE HGB/HCT, BLOOD
HCT: 39 %
HEMOGLOBIN: 12.9 g/dL

## 2014-10-28 LAB — OB RESULTS CONSOLE TSH: TSH: 1.1

## 2014-10-28 LAB — OB RESULTS CONSOLE GC/CHLAMYDIA
Chlamydia: NEGATIVE
Gonorrhea: NEGATIVE

## 2014-10-28 LAB — OB RESULTS CONSOLE VARICELLA ZOSTER ANTIBODY, IGG: VARICELLA IGG: IMMUNE

## 2014-10-28 LAB — OB RESULTS CONSOLE HIV ANTIBODY (ROUTINE TESTING): HIV: NONREACTIVE

## 2014-10-28 LAB — OB RESULTS CONSOLE ANTIBODY SCREEN: Antibody Screen: NEGATIVE

## 2014-10-28 LAB — OB RESULTS CONSOLE PLATELET COUNT: PLATELETS: 202 10*3/uL

## 2014-10-28 LAB — OB RESULTS CONSOLE HEPATITIS B SURFACE ANTIGEN: Hepatitis B Surface Ag: NEGATIVE

## 2014-10-28 LAB — OB RESULTS CONSOLE ABO/RH: RH Type: POSITIVE

## 2014-11-05 LAB — CYTOLOGY - PAP: Pap: NEGATIVE

## 2014-12-04 ENCOUNTER — Other Ambulatory Visit (HOSPITAL_COMMUNITY): Payer: Self-pay | Admitting: Obstetrics and Gynecology

## 2014-12-04 ENCOUNTER — Encounter (HOSPITAL_COMMUNITY): Payer: Self-pay | Admitting: Obstetrics and Gynecology

## 2014-12-04 DIAGNOSIS — Z3A18 18 weeks gestation of pregnancy: Secondary | ICD-10-CM

## 2014-12-04 DIAGNOSIS — O280 Abnormal hematological finding on antenatal screening of mother: Secondary | ICD-10-CM

## 2014-12-04 DIAGNOSIS — Z3689 Encounter for other specified antenatal screening: Secondary | ICD-10-CM

## 2014-12-04 DIAGNOSIS — O2622 Pregnancy care for patient with recurrent pregnancy loss, second trimester: Secondary | ICD-10-CM

## 2014-12-23 ENCOUNTER — Other Ambulatory Visit (HOSPITAL_COMMUNITY): Payer: Self-pay | Admitting: Obstetrics and Gynecology

## 2014-12-23 ENCOUNTER — Encounter (HOSPITAL_COMMUNITY): Payer: Self-pay

## 2014-12-23 ENCOUNTER — Ambulatory Visit (HOSPITAL_COMMUNITY)
Admission: RE | Admit: 2014-12-23 | Discharge: 2014-12-23 | Disposition: A | Discharge status: Home / Self Care | Payer: Medicaid Other | Source: Ambulatory Visit | Attending: Obstetrics and Gynecology | Admitting: Obstetrics and Gynecology

## 2014-12-23 ENCOUNTER — Encounter (HOSPITAL_COMMUNITY): Payer: Self-pay | Admitting: *Deleted

## 2014-12-23 DIAGNOSIS — Z3A16 16 weeks gestation of pregnancy: Secondary | ICD-10-CM | POA: Diagnosis not present

## 2014-12-23 DIAGNOSIS — Z3A18 18 weeks gestation of pregnancy: Secondary | ICD-10-CM

## 2014-12-23 DIAGNOSIS — F1910 Other psychoactive substance abuse, uncomplicated: Secondary | ICD-10-CM

## 2014-12-23 DIAGNOSIS — Z315 Encounter for genetic counseling: Secondary | ICD-10-CM | POA: Insufficient documentation

## 2014-12-23 DIAGNOSIS — O280 Abnormal hematological finding on antenatal screening of mother: Secondary | ICD-10-CM | POA: Insufficient documentation

## 2014-12-23 DIAGNOSIS — Z3689 Encounter for other specified antenatal screening: Secondary | ICD-10-CM

## 2014-12-23 DIAGNOSIS — O351XX0 Maternal care for (suspected) chromosomal abnormality in fetus, not applicable or unspecified: Secondary | ICD-10-CM | POA: Diagnosis not present

## 2014-12-23 DIAGNOSIS — O99322 Drug use complicating pregnancy, second trimester: Secondary | ICD-10-CM | POA: Insufficient documentation

## 2014-12-23 DIAGNOSIS — O2622 Pregnancy care for patient with recurrent pregnancy loss, second trimester: Secondary | ICD-10-CM

## 2014-12-23 HISTORY — DX: Other psychoactive substance abuse, uncomplicated: F19.10

## 2014-12-23 IMAGING — US US OB DETAIL+14 WK
1 series · 12 of 28 positions shown · non-contrast
Comparison: none

[Series 1: us ob detail+14 wk · 0.18mm/px · 12 of 83 slices shown]
[im 4/83]
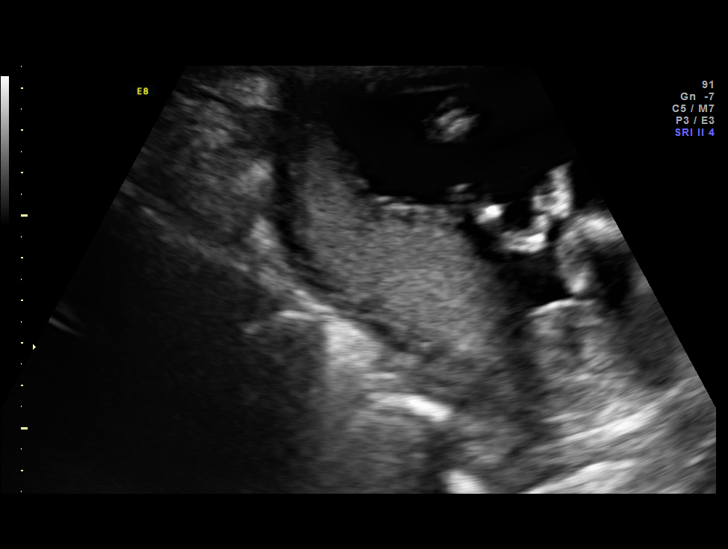
[im 10/83]
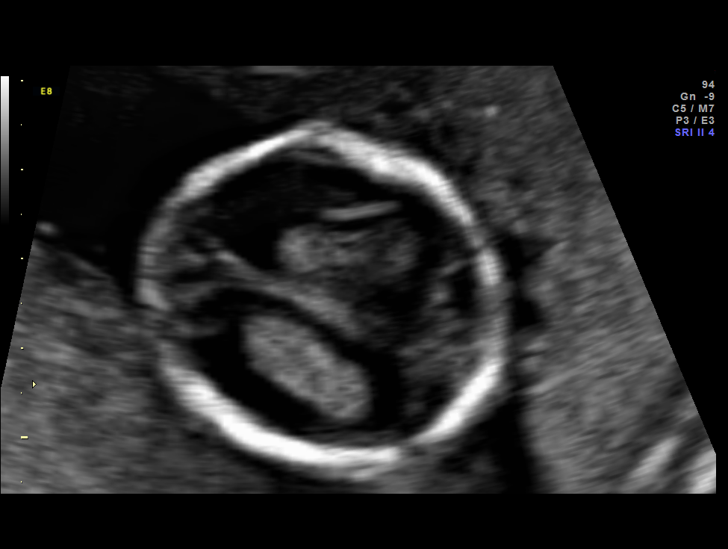
[im 16/83]
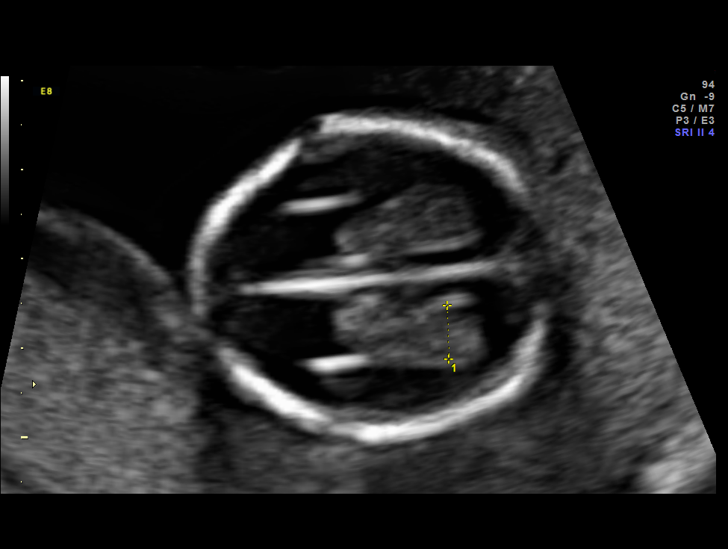
[im 25/83]
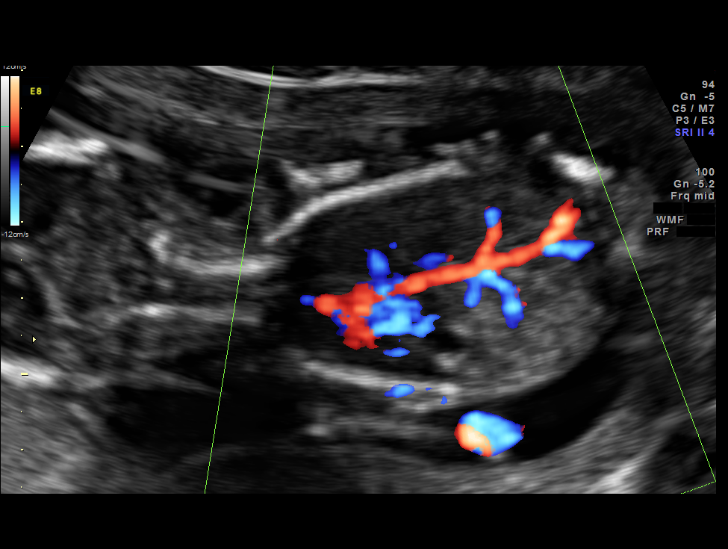
[im 31/83]
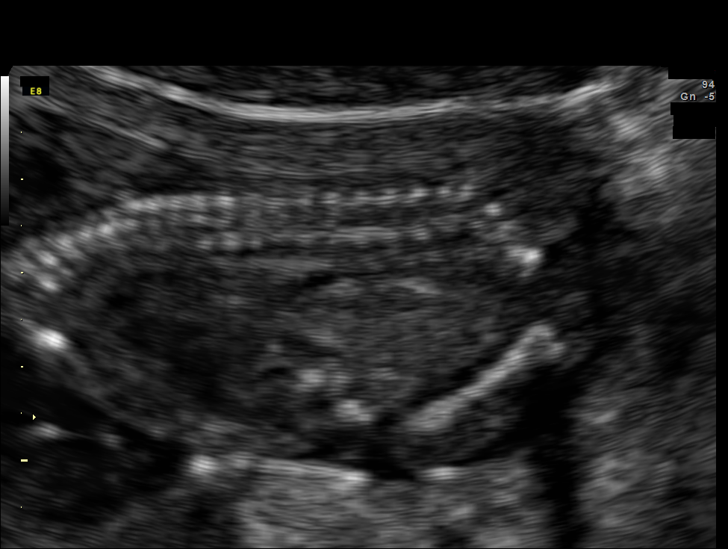
[im 37/83]
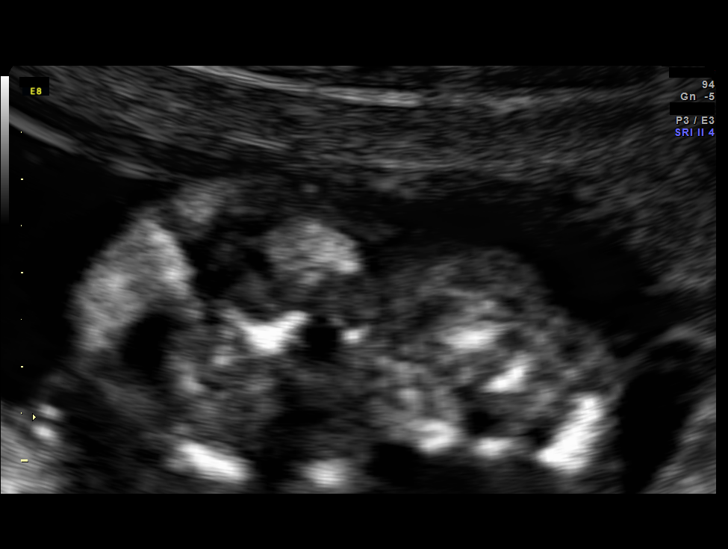
[im 46/83]
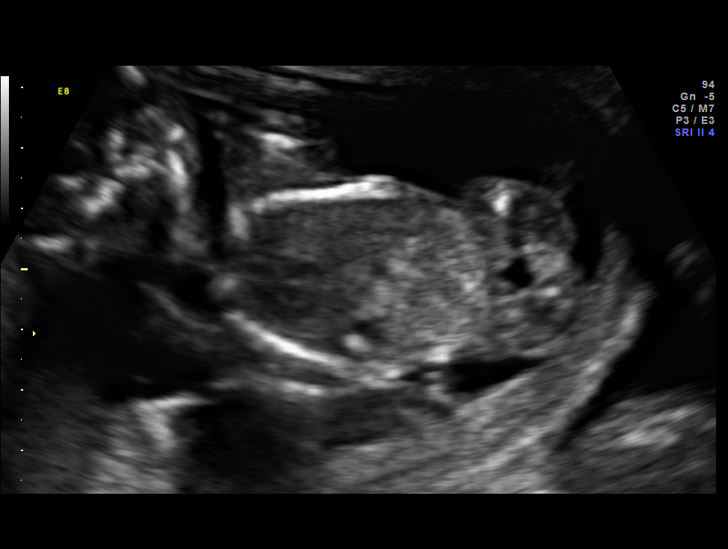
[im 52/83]
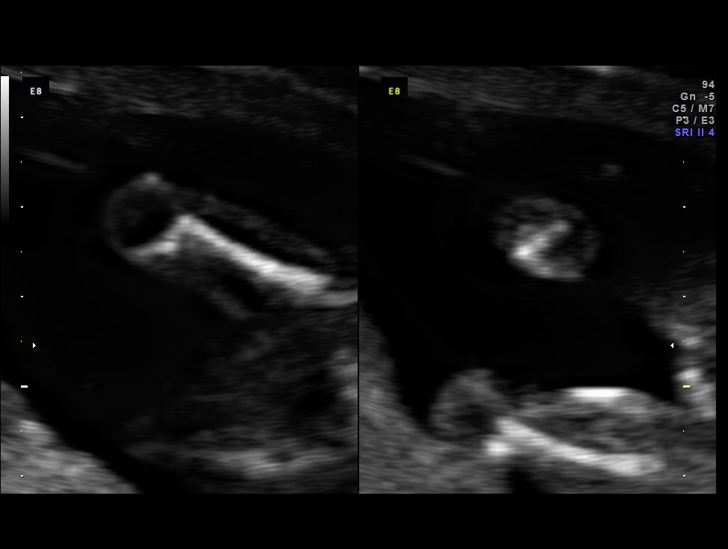
[im 58/83]
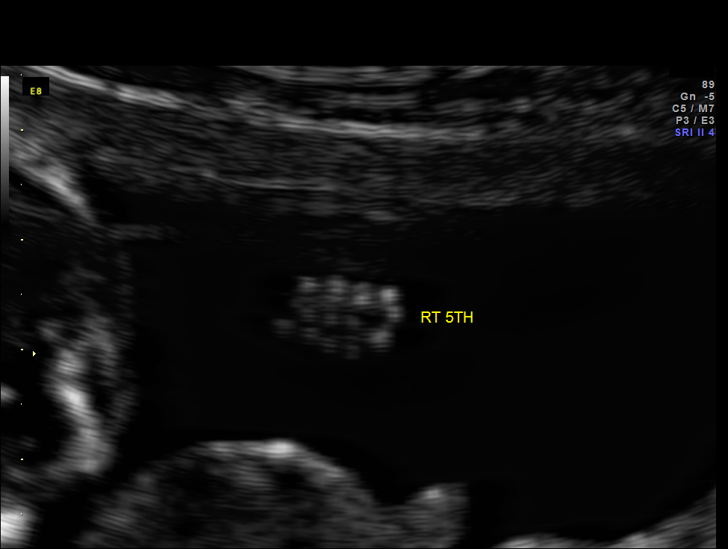
[im 67/83]
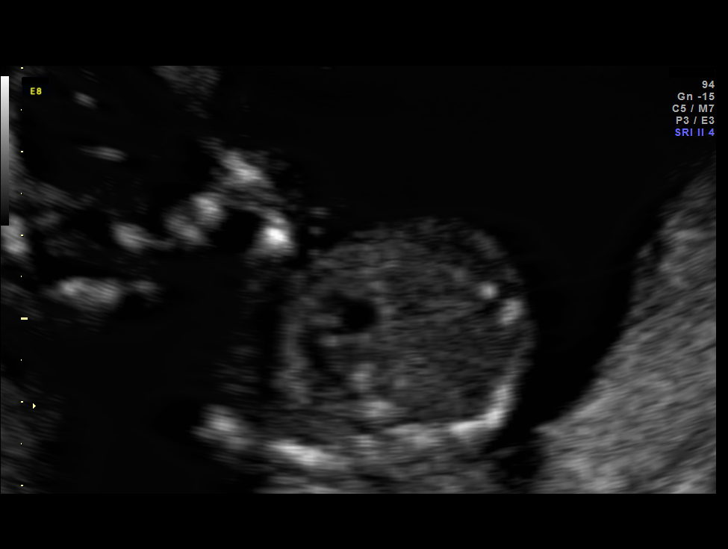
[im 73/83]
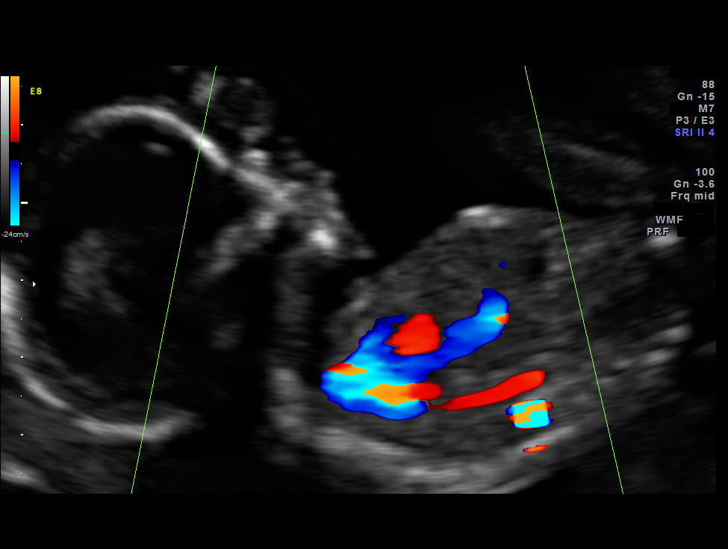
[im 79/83]
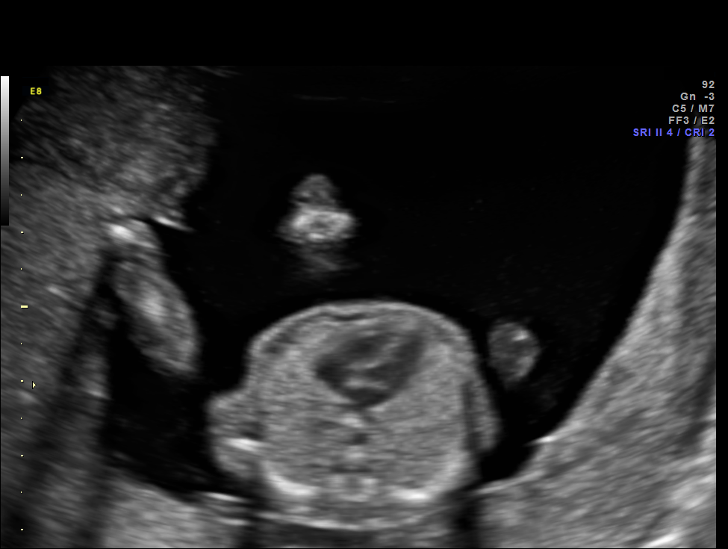

[12 of 28 positions shown; findings below may reference images not displayed]

OBSTETRICS REPORT
                      (Signed Final 12/23/2014 [DATE])

Service(s) Provided

 US OB DETAIL + 14 WK                                  76811.0
Indications

 Abnormal biochemical screen (quad) for Trisomy
 21 ([DATE], low risk panorama in office)
 Detailed fetal anatomic survey                        Z36
 High risk pregnancy due to maternal drug abuse

 16 weeks gestation of pregnancy
Fetal Evaluation

 Num Of Fetuses:    1
 Fetal Heart Rate:  144                          bpm
 Cardiac Activity:  Observed
 Presentation:      Variable
 Placenta:          Posterior Previa
 P. Cord            Visualized, central
 Insertion:

 Amniotic Fluid
 AFI FV:      Subjectively within normal limits
                                             Larg Pckt:     3.2  cm
Biometry

 BPD:     36.1  mm     G. Age:  17w 0d                CI:         80.0   70 - 86
 OFD:     45.1  mm                                    FL/HC:      14.9   14.6 -

 HC:     131.2  mm     G. Age:  16w 5d       32  %    HC/AC:      1.21   1.07 -

 AC:     108.1  mm     G. Age:  16w 5d       44  %    FL/BPD:
 FL:      19.5  mm     G. Age:  15w 6d       11  %    FL/AC:      18.0   20 - 24
 HUM:     20.3  mm     G. Age:  16w 0d       34  %
 CER:     16.5  mm     G. Age:  16w 4d       42  %
 Est. FW:     153  gm      0 lb 5 oz     46  %
Gestational Age

 LMP:           15w 0d        Date:  09/09/14                 EDD:   06/16/15
 U/S Today:     16w 4d                                        EDD:   06/05/15
 Best:          16w 6d     Det. By:  Early Ultrasound         EDD:   06/03/15
                                     (11/04/14)
Anatomy

 Cranium:          Appears normal         Aortic Arch:      Appears normal
 Fetal Cavum:      Not well visualized    Ductal Arch:      Appears normal
 Ventricles:       Appears normal         Diaphragm:        Appears normal
 Choroid Plexus:   Appears normal         Stomach:          Appears normal, left
                                                            sided
 Cerebellum:       Appears normal         Abdomen:          Appears normal
 Posterior Fossa:  Appears normal         Abdominal Wall:   Appears nml (cord
                                                            insert, abd wall)
 Nuchal Fold:      Appears normal         Cord Vessels:     Appears normal (3
                                                            vessel cord)
 Face:             Appears normal         Kidneys:          Appear normal
                   (orbits and profile)
 Lips:             Appears normal         Bladder:          Appears normal
 Heart:            Appears normal         Spine:            Appears normal
                   (4CH, axis, and
                   situs)
 RVOT:             Appears normal         Lower             Appears normal
                                          Extremities:
 LVOT:             Appears normal         Upper             Appears normal
                                          Extremities:

 Other:  Fetus appears to be a female. Heels and 5th digit visualized. Nasal
         bone visualized. Technically difficult due to fetal position.
Cervix Uterus Adnexa

 Cervical Length:    3.1      cm

 Cervix:       Normal appearance by transabdominal scan.
Impression

 SIUP at 16+6 weeks
 Normal detailed fetal anatomy; limited views of CSP
 Markers of aneuploidy: none
 Normal amniotic fluid volume
 Measurements consistent with early US
 Complete posterior previa
Recommendations

 Follow-up ultrasound in 4-6 weeks to reassess anatomy and
 placental location

 questions or concerns.

## 2014-12-23 NOTE — Progress Notes (Signed)
Genetic Counseling  High-Risk Gestation Note  Appointment Date:  12/23/2014 Referred By: Crystal Huang, Dionne, MD Date of Birth:  03-25-1991   Pregnancy History: [id number] Estimated Date of Delivery: 06/03/15 Estimated Gestational Age: 8645w6d Attending: Particia NearingMartha Decker, MD   Mrs. Crystal HousemanCaitlin Huang was seen for genetic counseling because of an increased risk for fetal Down syndrome based on first trimester screening through [facility]. [employer] Genetic Counseling Intern, Raeford RazorErica Cousins, assisted with genetic counseling under my direct supervision. Mrs. Crystal Huang was seen for genetic counseling in a previous pregnancy on 03/06/2013.   She was counseled regarding the First trimester screen result and the associated 1 in 120 risk for fetal Down syndrome.  We reviewed chromosomes, nondisjunction, and the common features and variable prognosis of Down syndrome.  In addition, we reviewed the screen adjusted reduction in risks for trisomy 3118.  We also discussed other explanations for a screen positive result including: differences in maternal metabolism and normal variation. She understands that this screening is not diagnostic for Down syndrome but provides a risk assessment.  We reviewed available screening options including noninvasive prenatal screening (NIPS)/cell free fetal DNA (cffDNA) testing, and detailed ultrasound.  She was counseled that screening tests are used to modify a patient's a priori risk for aneuploidy, typically based on age. This estimate provides a pregnancy specific risk assessment. We reviewed the benefits and limitations of each option.Specifically, we discussed the conditions for which each test screens, the detection rates, and false positive rates of each.   Ms. Crystal Huang previously had NIPS ([facility]) facilitated through her OB office which was within normal range for the conditions screened. We reviewed these results with the patient, which showed less than 1 in 10,000 risk  for trisomies 21, 18 and 13, and monosomy X (Turner syndrome).  In addition, the risk for triploidy/vanishing twin and sex chromosome trisomies (47,XXX and 47,XXY) was also low risk.  Analysis for 22q11 deletion syndrome was also low risk (1 in 13,300).  We reviewed that this testing identifies > 99% of pregnancies with trisomy 3721, trisomy 913, sex chromosome trisomies (47,XXX and 47,XXY), and triploidy. The detection rate for trisomy 18 is 96%.  The detection rate for monosomy X is ~92%.  The false positive rate is <0.1% for all conditions. Testing was also consistent with female fetal sex.  The patient did wish to know fetal sex.  She understands that this testing does not identify all genetic conditions and is not diagnostic for these chromosome conditions.   She was also counseled regarding diagnostic testing via amniocentesis. We reviewed the approximate 1 in 300-500 risk for complications for amniocentesis, including spontaneous pregnancy loss. After consideration of all the options, she elected to proceed with detailed ultrasound only. A complete ultrasound was performed today. The ultrasound report will be sent under separate cover. Amniocentesis was declined today. She understands that screening tests cannot rule out all birth defects or genetic syndromes. The patient was advised of this limitation and states she still does not want additional testing at this time. Follow-up ultrasound was planned for 01/20/15.   Ms. Crystal Huang was provided with written information regarding cystic fibrosis (CF) including the carrier frequency and incidence in the Caucasian population, the availability of carrier testing and prenatal diagnosis if indicated.  In addition, we discussed that CF is routinely screened for as part of the [facility] newborn screening panel.  She declined testing today.   Both family histories were reviewed and found to be contributory for a nephew to the father  of the pregnancy with seizures and another  nephew (brothers to each other) with speech delay. The underlying etiologies are not known, and both individuals are reportedly undergoing evaluations in an attempt to determine the cause. Epilepsy occurs in approximately 1% of the population and can have many causes.  Approximately 80% of epilepsy is thought to be idiopathic while the remaining 20% is secondary to a variety of factors such as perinatal events, infections, trauma and genetic disease.  A specific diagnosis in an affected individual is necessary to accurately assess the risk for other family members to develop epilepsy.  In the absence of a known etiology, epilepsy is thought to be caused by a combination of genetic and environmental factors, called multifactorial inheritance. There are also various causes for speech delay. Additional information is needed to accurately assess recurrence risk for relatives.   The remaining family history was remarkable for vision problems for the father of the pregnancy. This was previously discussed during the patient's genetic counseling visit in 2014. The patient reported that their son, currently 41 months old, was recently diagnosed with extreme far-sightedness and has to wear glasses. We discussed that additional information is needed regarding the specific etiology for vision concerns in the family. However, recurrence risk could be up to 50% in the case of an underlying autosomal dominant cause. The patient may contact us if additional information is obtained regarding this family history. Without further information regarding the provided family history, an accurate genetic risk cannot be calculated. Further genetic counseling is warranted if more information is obtained.  Crystal Huang denied exposure to environmental toxins or chemical agents. She denied the use of alcohol. She reported smoking approximately 3 cigarettes per day. The associations of smoking in pregnancy were reviewed and cessation  encouraged. 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   I counseled Mrs. Crystal Huang for approximately 45 minutes regarding the above risks and available options.   Quinn Plowman, MS,  Certified Genetic Counselor  12/23/2014

## 2014-12-24 ENCOUNTER — Other Ambulatory Visit (HOSPITAL_COMMUNITY): Payer: Self-pay | Admitting: Obstetrics and Gynecology

## 2014-12-24 ENCOUNTER — Other Ambulatory Visit (HOSPITAL_COMMUNITY): Payer: Self-pay | Admitting: Maternal and Fetal Medicine

## 2014-12-24 DIAGNOSIS — O99322 Drug use complicating pregnancy, second trimester: Secondary | ICD-10-CM

## 2014-12-24 DIAGNOSIS — F1910 Other psychoactive substance abuse, uncomplicated: Secondary | ICD-10-CM

## 2014-12-24 DIAGNOSIS — O289 Unspecified abnormal findings on antenatal screening of mother: Secondary | ICD-10-CM

## 2015-01-02 ENCOUNTER — Encounter (HOSPITAL_COMMUNITY): Payer: Self-pay

## 2015-01-02 ENCOUNTER — Inpatient Hospital Stay (HOSPITAL_COMMUNITY): Admission: RE | Admit: 2015-01-02 | Payer: Self-pay | Source: Ambulatory Visit

## 2015-01-20 ENCOUNTER — Encounter (HOSPITAL_COMMUNITY): Payer: Self-pay

## 2015-01-20 ENCOUNTER — Other Ambulatory Visit (HOSPITAL_COMMUNITY): Payer: Self-pay | Admitting: Maternal and Fetal Medicine

## 2015-01-20 ENCOUNTER — Ambulatory Visit (HOSPITAL_COMMUNITY)
Admission: RE | Admit: 2015-01-20 | Discharge: 2015-01-20 | Disposition: A | Discharge status: Home / Self Care | Payer: Medicaid Other | Source: Ambulatory Visit | Attending: Obstetrics and Gynecology | Admitting: Obstetrics and Gynecology

## 2015-01-20 DIAGNOSIS — Z36 Encounter for antenatal screening of mother: Secondary | ICD-10-CM | POA: Insufficient documentation

## 2015-01-20 DIAGNOSIS — O283 Abnormal ultrasonic finding on antenatal screening of mother: Secondary | ICD-10-CM | POA: Insufficient documentation

## 2015-01-20 DIAGNOSIS — IMO0002 Reserved for concepts with insufficient information to code with codable children: Secondary | ICD-10-CM | POA: Insufficient documentation

## 2015-01-20 DIAGNOSIS — F1910 Other psychoactive substance abuse, uncomplicated: Secondary | ICD-10-CM

## 2015-01-20 DIAGNOSIS — Z3A20 20 weeks gestation of pregnancy: Secondary | ICD-10-CM | POA: Insufficient documentation

## 2015-01-20 DIAGNOSIS — O99322 Drug use complicating pregnancy, second trimester: Secondary | ICD-10-CM

## 2015-01-20 DIAGNOSIS — Z0489 Encounter for examination and observation for other specified reasons: Secondary | ICD-10-CM | POA: Insufficient documentation

## 2015-01-20 DIAGNOSIS — O289 Unspecified abnormal findings on antenatal screening of mother: Secondary | ICD-10-CM

## 2015-03-03 ENCOUNTER — Other Ambulatory Visit (HOSPITAL_COMMUNITY): Payer: Self-pay | Admitting: Maternal and Fetal Medicine

## 2015-03-03 ENCOUNTER — Ambulatory Visit (HOSPITAL_COMMUNITY)
Admission: RE | Admit: 2015-03-03 | Discharge: 2015-03-03 | Disposition: A | Discharge status: Home / Self Care | Payer: Medicaid Other | Source: Ambulatory Visit | Attending: Obstetrics and Gynecology | Admitting: Obstetrics and Gynecology

## 2015-03-03 DIAGNOSIS — F1990 Other psychoactive substance use, unspecified, uncomplicated: Secondary | ICD-10-CM | POA: Diagnosis present

## 2015-03-03 DIAGNOSIS — F1910 Other psychoactive substance abuse, uncomplicated: Secondary | ICD-10-CM

## 2015-03-03 DIAGNOSIS — O99322 Drug use complicating pregnancy, second trimester: Secondary | ICD-10-CM | POA: Insufficient documentation

## 2015-03-03 DIAGNOSIS — Z36 Encounter for antenatal screening of mother: Secondary | ICD-10-CM | POA: Insufficient documentation

## 2015-03-03 DIAGNOSIS — O4400 Placenta previa specified as without hemorrhage, unspecified trimester: Secondary | ICD-10-CM | POA: Insufficient documentation

## 2015-03-03 DIAGNOSIS — Z3A26 26 weeks gestation of pregnancy: Secondary | ICD-10-CM | POA: Diagnosis not present

## 2015-03-03 DIAGNOSIS — O09299 Supervision of pregnancy with other poor reproductive or obstetric history, unspecified trimester: Secondary | ICD-10-CM | POA: Diagnosis not present

## 2015-03-03 DIAGNOSIS — O289 Unspecified abnormal findings on antenatal screening of mother: Secondary | ICD-10-CM

## 2015-04-01 ENCOUNTER — Other Ambulatory Visit (HOSPITAL_COMMUNITY): Payer: Self-pay | Admitting: Maternal and Fetal Medicine

## 2015-04-01 ENCOUNTER — Ambulatory Visit (HOSPITAL_COMMUNITY)
Admission: RE | Admit: 2015-04-01 | Discharge: 2015-04-01 | Disposition: A | Discharge status: Home / Self Care | Payer: Medicaid Other | Source: Ambulatory Visit | Attending: Obstetrics and Gynecology | Admitting: Obstetrics and Gynecology

## 2015-04-01 ENCOUNTER — Encounter (HOSPITAL_COMMUNITY): Payer: Self-pay

## 2015-04-01 DIAGNOSIS — Z3A31 31 weeks gestation of pregnancy: Secondary | ICD-10-CM | POA: Insufficient documentation

## 2015-04-01 DIAGNOSIS — O289 Unspecified abnormal findings on antenatal screening of mother: Secondary | ICD-10-CM

## 2015-04-01 DIAGNOSIS — O99322 Drug use complicating pregnancy, second trimester: Secondary | ICD-10-CM

## 2015-04-01 DIAGNOSIS — O09293 Supervision of pregnancy with other poor reproductive or obstetric history, third trimester: Secondary | ICD-10-CM | POA: Insufficient documentation

## 2015-04-01 DIAGNOSIS — F1910 Other psychoactive substance abuse, uncomplicated: Secondary | ICD-10-CM

## 2015-04-01 DIAGNOSIS — O4403 Placenta previa specified as without hemorrhage, third trimester: Secondary | ICD-10-CM | POA: Insufficient documentation

## 2015-04-01 DIAGNOSIS — O283 Abnormal ultrasonic finding on antenatal screening of mother: Secondary | ICD-10-CM | POA: Diagnosis not present

## 2015-04-01 IMAGING — US US UA CORD DOPPLER
2 series · 12 of 28 positions shown · non-contrast
Comparison: none

[Series 1: us ob follow up · 12 acquisitions, 2 frames shown (1 of 2)]
[im 3/12]
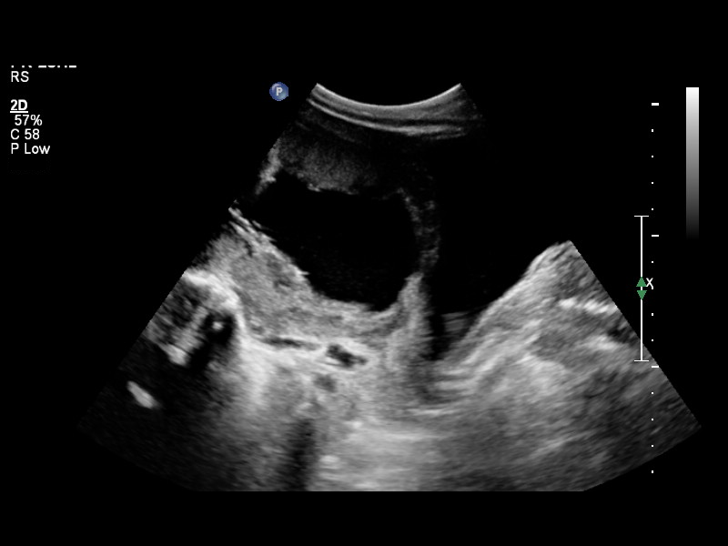
[im 9/12]
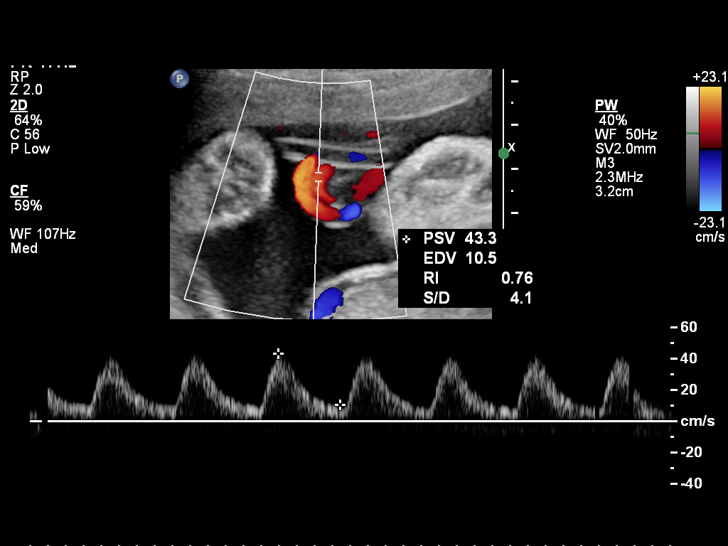

[Series 1: us ob follow up · 10 of 60 slices shown (2 of 2)]
[im 1/60]
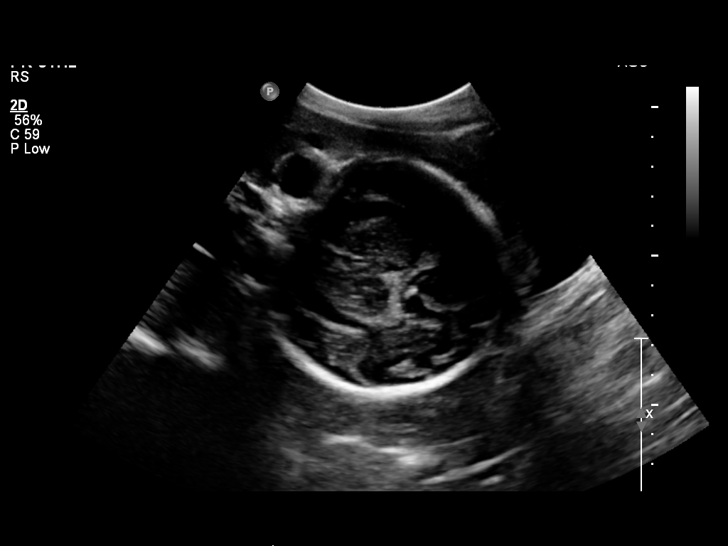
[im 9/60]
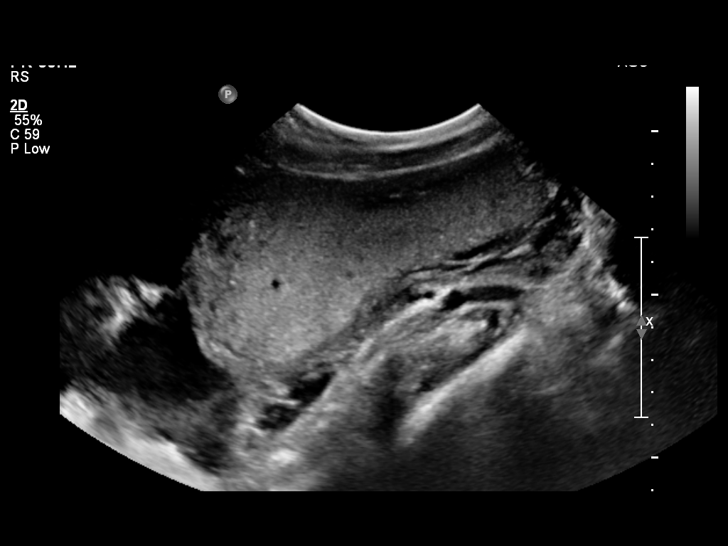
[im 14/60]
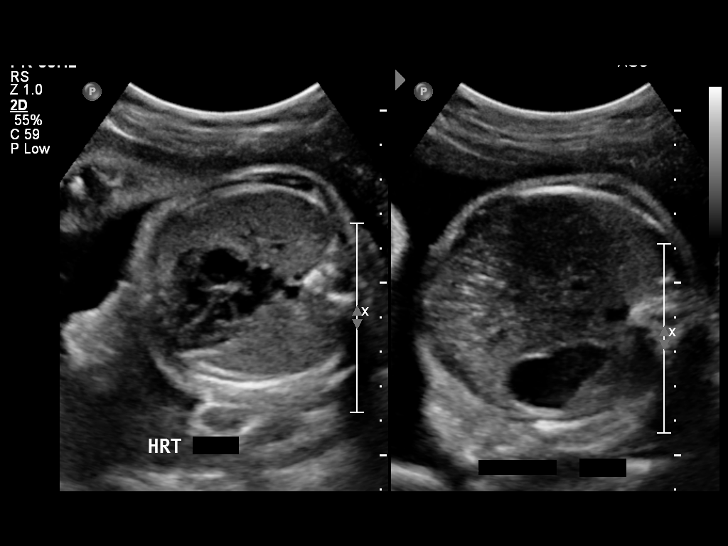
[im 19/60]
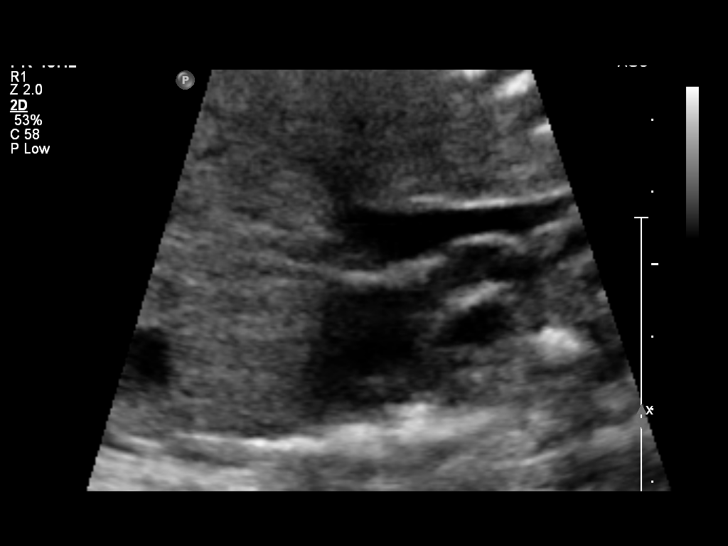
[im 27/60]
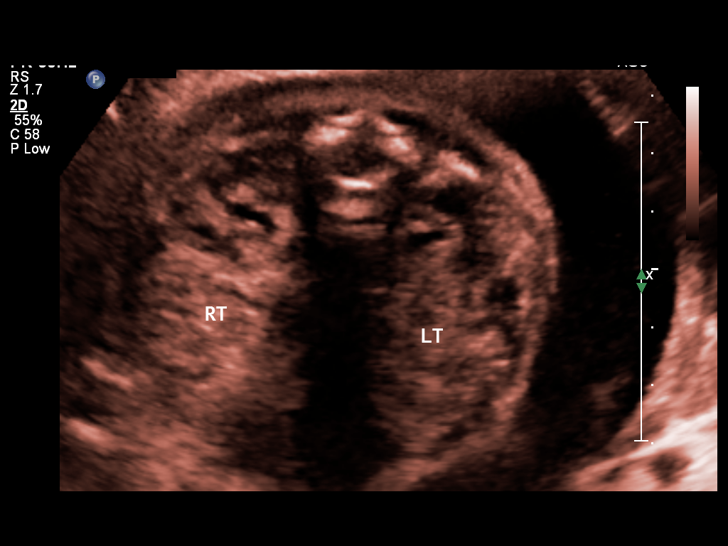
[im 33/60]
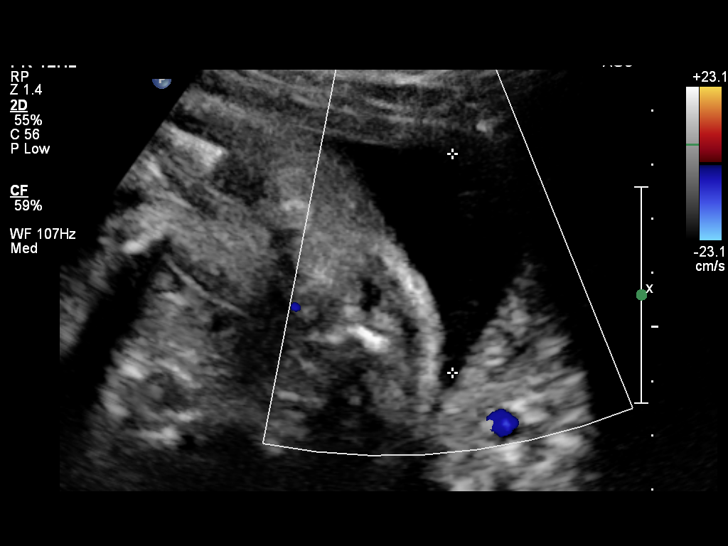
[im 38/60]
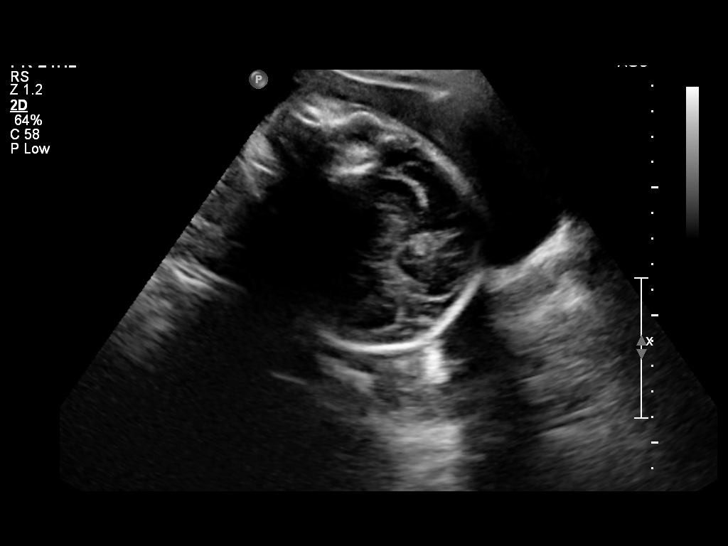
[im 46/60]
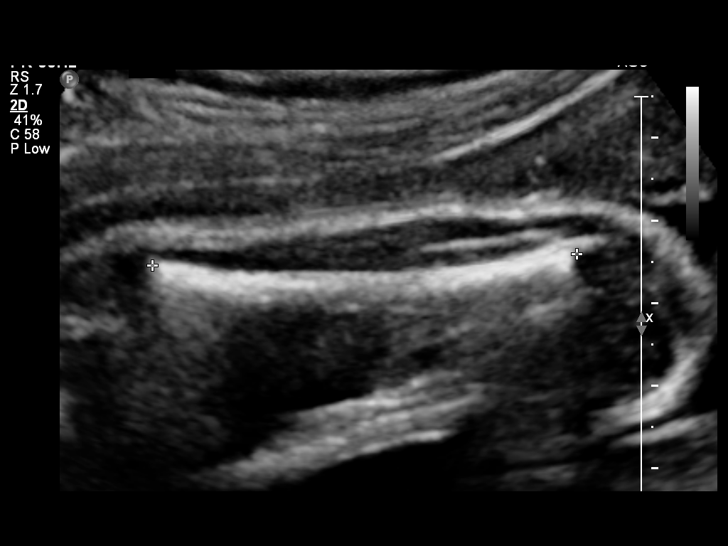
[im 51/60]
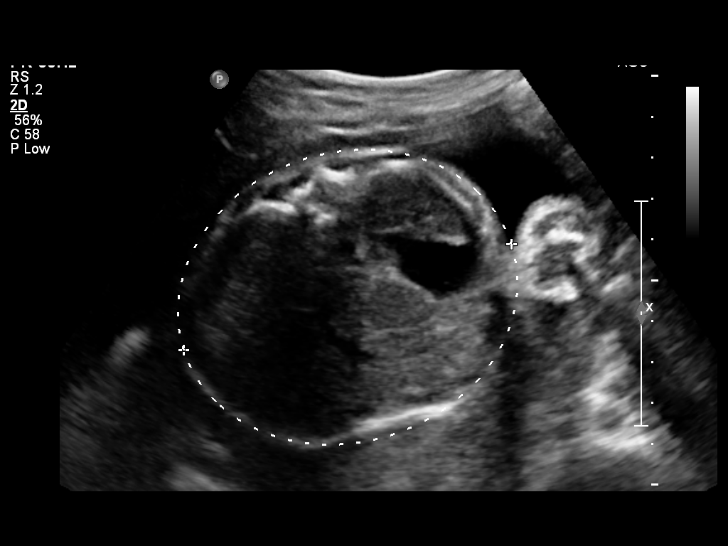
[im 57/60]
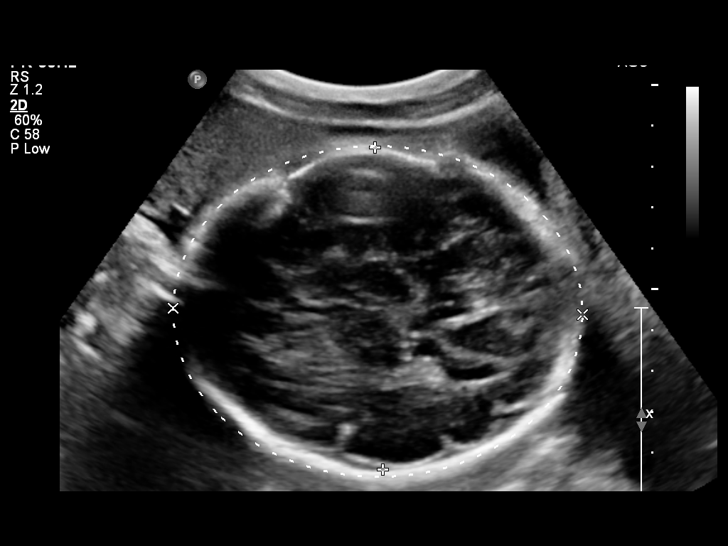

[12 of 28 positions shown; findings below may reference images not displayed]

OBSTETRICS REPORT
(Corrected Final 04/08/2015 [DATE])

Service(s) Provided

US OB FOLLOW UP                                       76816.1
US UA CORD DOPPLER                                    76820.0
Indications

31 weeks gestation of pregnancy
Placenta previa/Low lying: No bleeding
Abnormal first trimester screen, DSR [DATE] (low
risk panorama)
High risk pregnancy due to maternal drug abuse
(subutex)
Poor obstetric history: Previous preeclampsia /
eclampsia/gestational HTN
Poor obstetric history: Previous fetal growth
restriction (FGR)
Fetal Evaluation

Num Of Fetuses:    1
Fetal Heart Rate:  134                          bpm
Cardiac Activity:  Observed
Presentation:      Cephalic
Placenta:          Right lateral, low-lying

Amniotic Fluid
AFI FV:      Subjectively within normal limits
AFI Sum:     18.4    cm       69  %Tile     Larg Pckt:    6.16  cm
RUQ:   6.16    cm   RLQ:    3.1    cm    LUQ:   5.12    cm   LLQ:    4.02   cm
Biometry

BPD:     77.9  mm     G. Age:  31w 2d                CI:        74.71   70 - 86
FL/HC:      17.8   19.3 -
21.3
HC:       286  mm     G. Age:  31w 3d       25  %    HC/AC:      1.19   0.96 -
1.17
AC:     241.3  mm     G. Age:  28w 3d      < 3  %    FL/BPD:     65.2   71 - 87
FL:      50.8  mm     G. Age:  27w 2d      < 3  %    FL/AC:      21.1   20 - 24
HUM:     46.1  mm     G. Age:  27w 1d      < 5  %

Est. FW:    9506  gm    2 lb 12 oz      14  %
Gestational Age
LMP:           29w 1d        Date:  09/09/14                 EDD:   06/16/15
U/S Today:     29w 4d                                        EDD:   06/13/15
Best:          31w 0d     Det. By:  Early Ultrasound         EDD:   06/03/15
(11/04/14)
Anatomy

Cranium:          Previously seen        Aortic Arch:      Appears normal
Fetal Cavum:      Previously seen        Ductal Arch:      Previously seen
Ventricles:       Appears normal         Diaphragm:        Appears normal
Choroid Plexus:   Previously seen        Stomach:          Appears normal, left
sided
Cerebellum:       Previously seen        Abdomen:          Appears normal
Posterior Fossa:  Previously seen        Abdominal Wall:   Previously seen
Nuchal Fold:      Previously seen        Cord Vessels:     Previously seen
Face:             Orbits and profile     Kidneys:          Appear normal
previously seen
Lips:             Previously seen        Bladder:          Appears normal
Palate:           Previously seen        Spine:            Previously seen
Heart:            Appears normal         Lower             Previously seen
(4CH, axis, and        Extremities:
situs)
RVOT:             Previously seen        Upper             Previously seen
Extremities:
LVOT:             Appears normal

Other:  Female gender seen. Heels and 5th digit previously seen. Nasal bone
previously visualized. Technically difficult due to fetal position.
Targeted Anatomy

Fetal Central Nervous System
Lat. Ventricles:
Doppler - Fetal Vessels

Umbilical Artery
S/D:   4.1            95  %tile

Cervix Uterus Adnexa

Cervical Length:    3.27     cm

Cervix:       Normal appearance by transabdominal scan.
Uterus:       No abnormality visualized.
Left Ovary:    Within normal limits.
Right Ovary:   Within normal limits.

Adnexa:     No abnormality visualized. No adnexal mass visualized.
Impression

SIUP at 31+0 weeks
Normal interval anatomy; anatomic survey complete
Normal amniotic fluid volume
EFW at the 14th %tile; AC < 3rd %tile
UA dopplers were in the high normal range for this GA
Right lateral placenta; low-lying
Recommendations

Weekly BPPs and UA dopplers
Growth in 3 weeks with EV to evaluate placental edge

questions or concerns.
Attending Physician, ANA MONY

## 2015-04-08 ENCOUNTER — Other Ambulatory Visit (HOSPITAL_COMMUNITY): Payer: Self-pay | Admitting: Maternal and Fetal Medicine

## 2015-04-08 ENCOUNTER — Other Ambulatory Visit (HOSPITAL_COMMUNITY): Payer: Self-pay | Admitting: Obstetrics and Gynecology

## 2015-04-08 ENCOUNTER — Ambulatory Visit (HOSPITAL_COMMUNITY)
Admission: RE | Admit: 2015-04-08 | Discharge: 2015-04-08 | Disposition: A | Discharge status: Home / Self Care | Payer: Medicaid Other | Source: Ambulatory Visit | Attending: Obstetrics and Gynecology | Admitting: Obstetrics and Gynecology

## 2015-04-08 DIAGNOSIS — F1921 Other psychoactive substance dependence, in remission: Secondary | ICD-10-CM | POA: Insufficient documentation

## 2015-04-08 DIAGNOSIS — O09293 Supervision of pregnancy with other poor reproductive or obstetric history, third trimester: Secondary | ICD-10-CM

## 2015-04-08 DIAGNOSIS — O4403 Placenta previa specified as without hemorrhage, third trimester: Secondary | ICD-10-CM

## 2015-04-08 DIAGNOSIS — O99320 Drug use complicating pregnancy, unspecified trimester: Secondary | ICD-10-CM | POA: Insufficient documentation

## 2015-04-08 DIAGNOSIS — O289 Unspecified abnormal findings on antenatal screening of mother: Secondary | ICD-10-CM

## 2015-04-08 DIAGNOSIS — O99323 Drug use complicating pregnancy, third trimester: Secondary | ICD-10-CM | POA: Diagnosis not present

## 2015-04-08 DIAGNOSIS — Z3A32 32 weeks gestation of pregnancy: Secondary | ICD-10-CM

## 2015-04-08 DIAGNOSIS — O365930 Maternal care for other known or suspected poor fetal growth, third trimester, not applicable or unspecified: Secondary | ICD-10-CM | POA: Insufficient documentation

## 2015-04-15 ENCOUNTER — Other Ambulatory Visit (HOSPITAL_COMMUNITY): Payer: Self-pay | Admitting: Maternal and Fetal Medicine

## 2015-04-15 ENCOUNTER — Ambulatory Visit (HOSPITAL_COMMUNITY)
Admission: RE | Admit: 2015-04-15 | Discharge: 2015-04-15 | Disposition: A | Discharge status: Home / Self Care | Payer: Medicaid Other | Source: Ambulatory Visit | Attending: Maternal and Fetal Medicine | Admitting: Maternal and Fetal Medicine

## 2015-04-15 DIAGNOSIS — O283 Abnormal ultrasonic finding on antenatal screening of mother: Secondary | ICD-10-CM | POA: Diagnosis not present

## 2015-04-15 DIAGNOSIS — Z3A33 33 weeks gestation of pregnancy: Secondary | ICD-10-CM | POA: Insufficient documentation

## 2015-04-15 DIAGNOSIS — Z3A32 32 weeks gestation of pregnancy: Secondary | ICD-10-CM

## 2015-04-15 DIAGNOSIS — O4413 Placenta previa with hemorrhage, third trimester: Secondary | ICD-10-CM | POA: Diagnosis not present

## 2015-04-15 DIAGNOSIS — O09293 Supervision of pregnancy with other poor reproductive or obstetric history, third trimester: Secondary | ICD-10-CM | POA: Insufficient documentation

## 2015-04-15 DIAGNOSIS — O365930 Maternal care for other known or suspected poor fetal growth, third trimester, not applicable or unspecified: Secondary | ICD-10-CM | POA: Insufficient documentation

## 2015-04-15 DIAGNOSIS — O99323 Drug use complicating pregnancy, third trimester: Secondary | ICD-10-CM

## 2015-04-15 DIAGNOSIS — O4403 Placenta previa specified as without hemorrhage, third trimester: Secondary | ICD-10-CM

## 2015-04-15 DIAGNOSIS — O289 Unspecified abnormal findings on antenatal screening of mother: Secondary | ICD-10-CM

## 2015-04-15 IMAGING — US US UA DOPPLER RE-EVAL
1 series · 13 of 24 positions shown · non-contrast
Comparison: none

[Series 1: us ob follow up · 24 acquisitions, 13 frames shown]
[im 1/24]
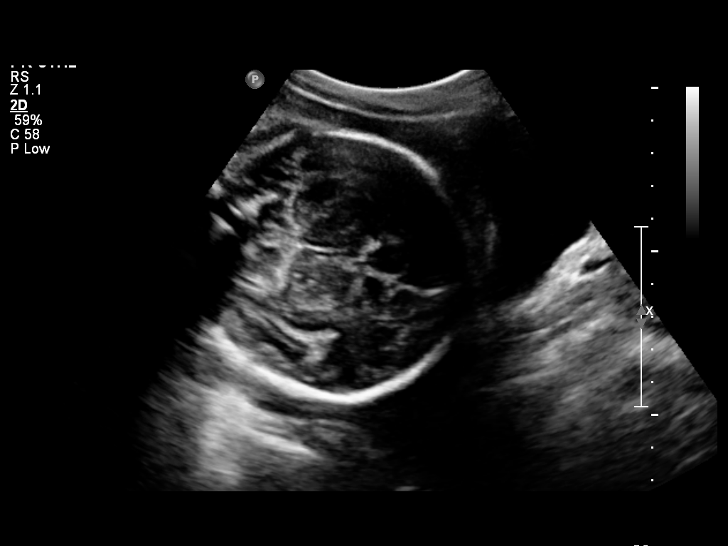
[im 3/24]
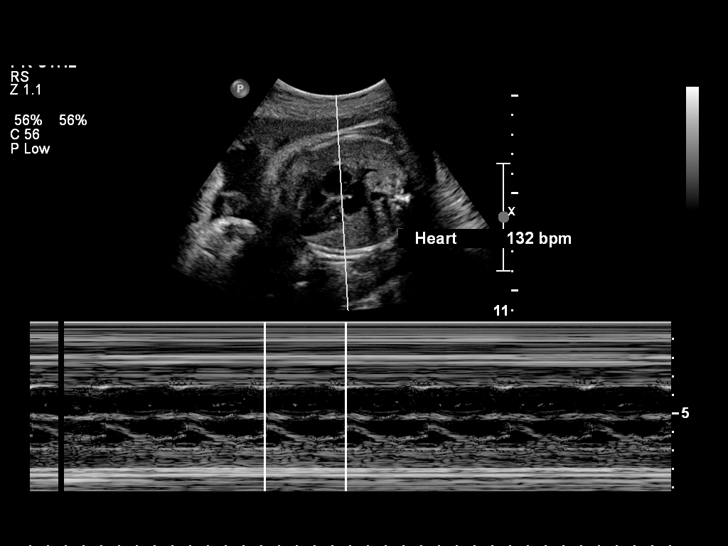
[im 5/24]
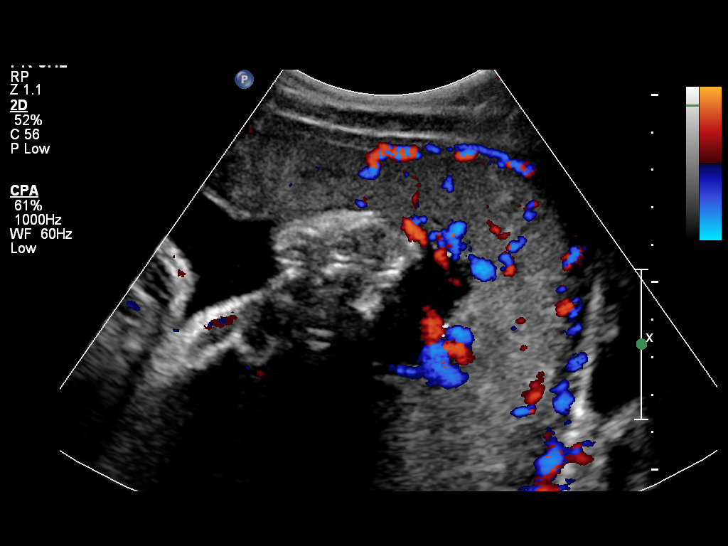
[im 7/24]
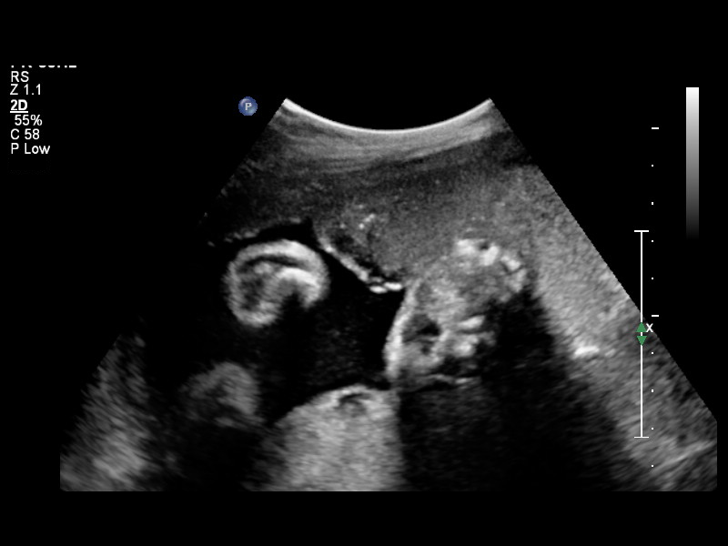
[im 9/24]
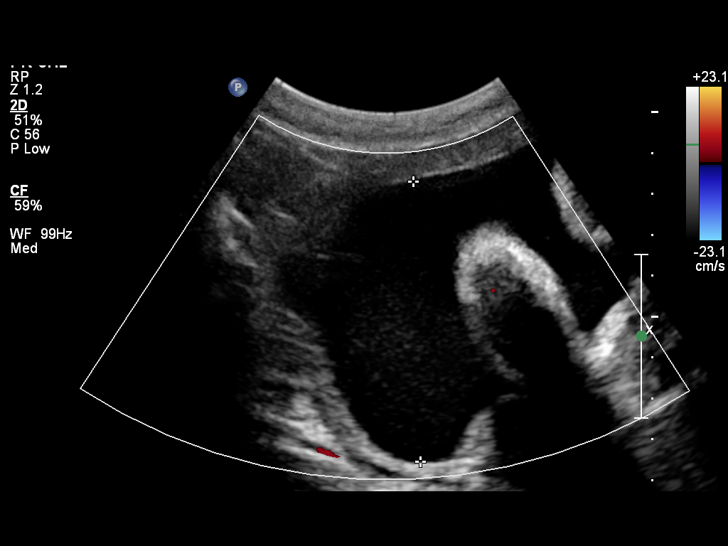
[im 11/24]
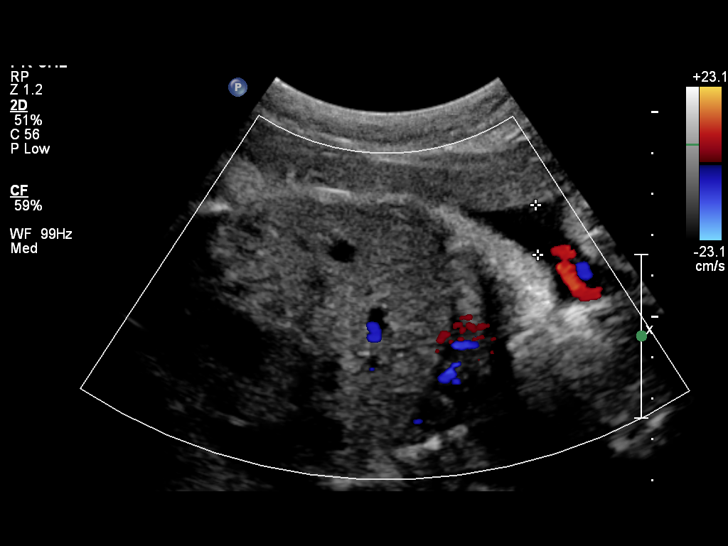
[im 13/24]
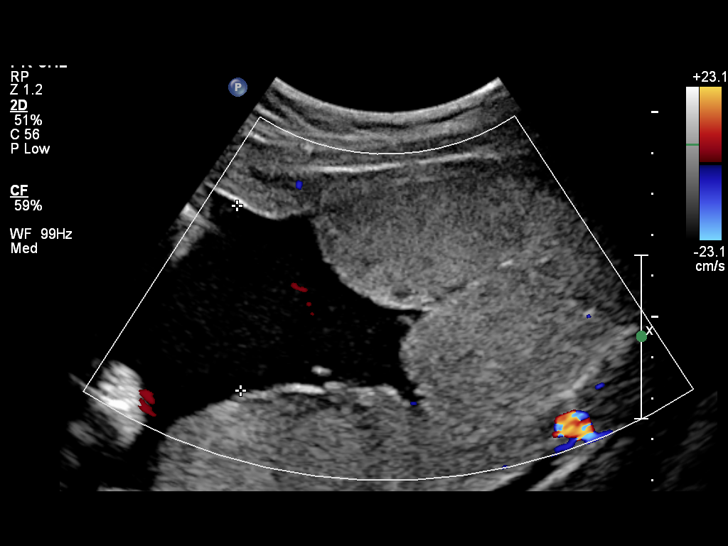
[im 14/24]
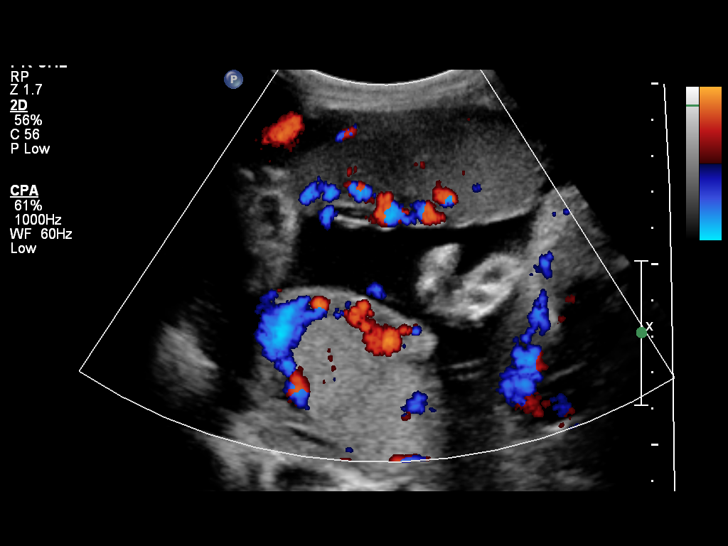
[im 16/24]
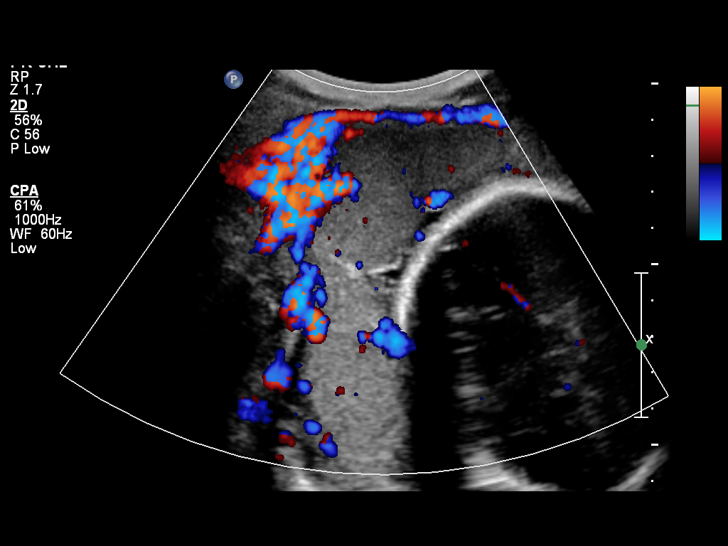
[im 18/24]
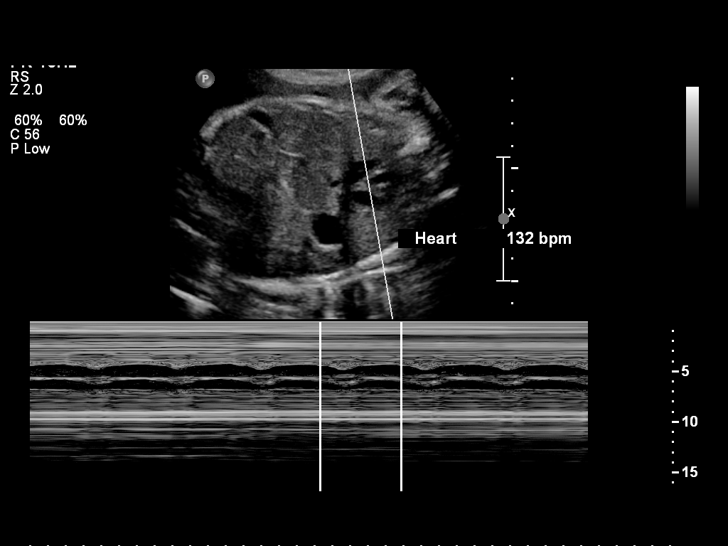
[im 20/24]
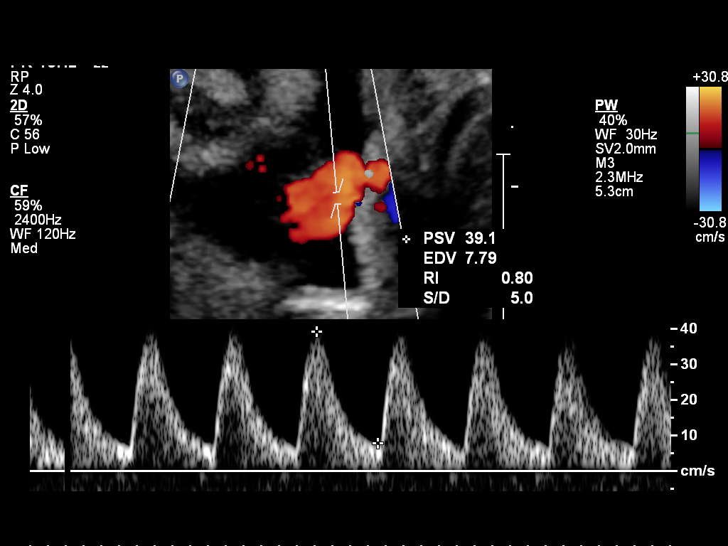
[im 22/24]
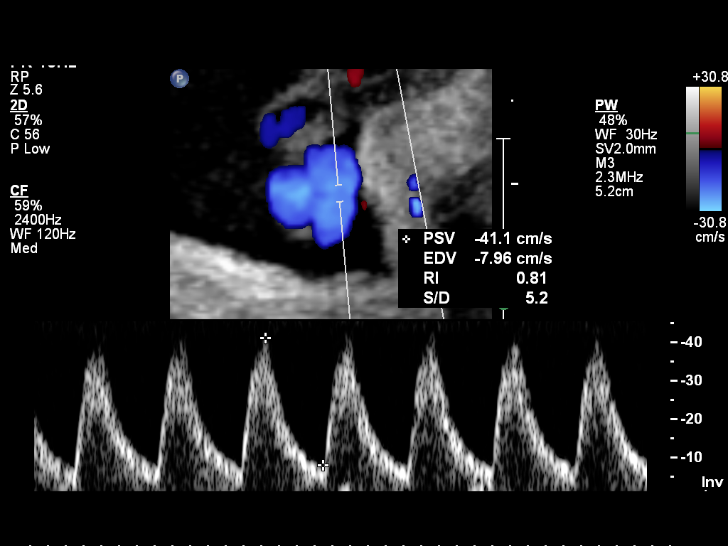
[im 24/24]
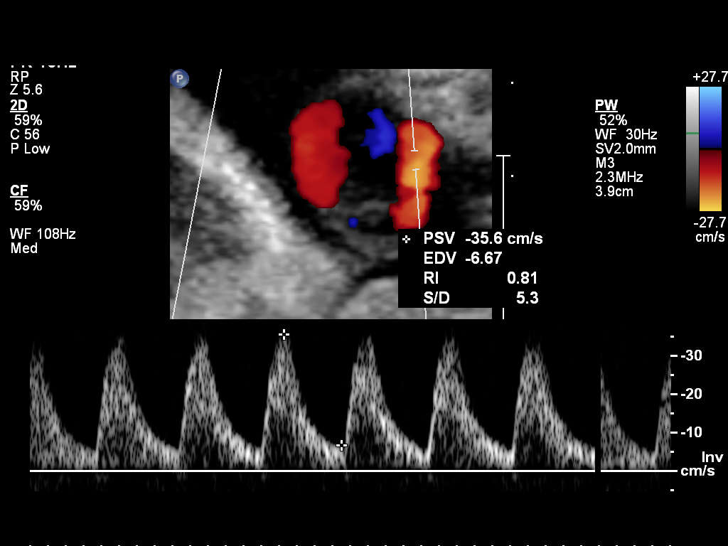

[13 of 24 positions shown; findings below may reference images not displayed]

OBSTETRICS REPORT
(Signed Final 04/15/2015 [DATE])

Service(s) Provided

US UA DOPPLER RE-EVAL                                 76828.1
Indications

Placenta previa/Low lying: Bleeding
Abnormal first trimester screen, DSR [DATE] (low
risk NIPS)
High risk pregnancy due to maternal drug abuse
(subutex)
Poor obstetric history: Previous preeclampsia /
eclampsia/gestational HTN
Poor obstetric history: Previous fetal growth
restriction (FGR)
33 weeks gestation of pregnancy
Maternal care for known of suspected poor fetal
growth, third trimester, not applicable or unspecified
Fetal Evaluation

Num Of Fetuses:    1
Fetal Heart Rate:  132                          bpm
Cardiac Activity:  Observed
Presentation:      Cephalic
Placenta:          Right lateral low-lying
P. Cord            Previously seen as normal
Insertion:

Amniotic Fluid
AFI FV:      Subjectively within normal limits
AFI Sum:     15.3     cm      54  %Tile      Larg Pckt:   6.82  cm
RUQ:   6.82    cm   RLQ:    4.5    cm    LUQ:    1.21   cm    LLQ:   2.77    cm
Biophysical Evaluation

Amniotic F.V:   Pocket => 2 cm two         F. Tone:         Observed
planes
F. Movement:    Observed                   Score:           [DATE]
F. Breathing:   Observed
Gestational Age

LMP:           31w 1d        Date:  09/09/14                 EDD:    06/16/15
Best:          33w 0d     Det. By:  Early Ultrasound         EDD:    06/03/15
(11/04/14)
Doppler - Fetal Vessels

Umbilical Artery
S/D:   5.6        > 97.5   %tile
Umbilical Artery
Absent DFV:     No    Reverse DFV:    No

Impression

SIUP at 33+0 weeks
Normal amniotic fluid volume
BPP [DATE]
UA dopplers were elevated for this GA
Recommendations

Continue weekly BPPs and UA dopplers
Plan to reassess placental location next week

questions or concerns.

## 2015-04-15 NOTE — ED Notes (Signed)
Pt states she was seen at [facility] last Friday for bleeding.  Had US done.  Stopped bleeding on Saturday, but bleeding started again this morning.  Enough to saturate a panty liner and go through clothing. Denies pain. States +FM.

## 2015-04-22 ENCOUNTER — Encounter (HOSPITAL_COMMUNITY): Payer: Self-pay

## 2015-04-22 ENCOUNTER — Ambulatory Visit (HOSPITAL_COMMUNITY)
Admission: RE | Admit: 2015-04-22 | Discharge: 2015-04-22 | Disposition: A | Discharge status: Home / Self Care | Payer: Medicaid Other | Source: Ambulatory Visit | Attending: Obstetrics and Gynecology | Admitting: Obstetrics and Gynecology

## 2015-04-22 DIAGNOSIS — Z3A32 32 weeks gestation of pregnancy: Secondary | ICD-10-CM | POA: Insufficient documentation

## 2015-04-22 DIAGNOSIS — O99323 Drug use complicating pregnancy, third trimester: Secondary | ICD-10-CM

## 2015-04-22 DIAGNOSIS — O09293 Supervision of pregnancy with other poor reproductive or obstetric history, third trimester: Secondary | ICD-10-CM | POA: Insufficient documentation

## 2015-04-22 DIAGNOSIS — O4413 Placenta previa with hemorrhage, third trimester: Secondary | ICD-10-CM | POA: Diagnosis not present

## 2015-04-22 DIAGNOSIS — O289 Unspecified abnormal findings on antenatal screening of mother: Secondary | ICD-10-CM

## 2015-04-22 DIAGNOSIS — O283 Abnormal ultrasonic finding on antenatal screening of mother: Secondary | ICD-10-CM | POA: Insufficient documentation

## 2015-04-22 DIAGNOSIS — O4403 Placenta previa specified as without hemorrhage, third trimester: Secondary | ICD-10-CM

## 2015-04-22 DIAGNOSIS — Z3A34 34 weeks gestation of pregnancy: Secondary | ICD-10-CM | POA: Insufficient documentation

## 2015-04-29 ENCOUNTER — Encounter: Payer: Self-pay | Admitting: Obstetrics & Gynecology

## 2015-04-29 ENCOUNTER — Ambulatory Visit (INDEPENDENT_AMBULATORY_CARE_PROVIDER_SITE_OTHER): Payer: Medicaid Other | Admitting: Obstetrics & Gynecology

## 2015-04-29 ENCOUNTER — Encounter (HOSPITAL_COMMUNITY): Payer: Self-pay | Admitting: *Deleted

## 2015-04-29 ENCOUNTER — Ambulatory Visit (HOSPITAL_COMMUNITY)
Admission: RE | Admit: 2015-04-29 | Discharge: 2015-04-29 | Disposition: A | Discharge status: Home / Self Care | Payer: Medicaid Other | Source: Ambulatory Visit | Attending: Obstetrics and Gynecology | Admitting: Obstetrics and Gynecology

## 2015-04-29 VITALS — BP 115/75 | HR 81 | Temp 98.1°F | Ht 67.0 in | Wt 133.3 lb

## 2015-04-29 DIAGNOSIS — O283 Abnormal ultrasonic finding on antenatal screening of mother: Secondary | ICD-10-CM | POA: Insufficient documentation

## 2015-04-29 DIAGNOSIS — Z3A32 32 weeks gestation of pregnancy: Secondary | ICD-10-CM | POA: Diagnosis not present

## 2015-04-29 DIAGNOSIS — O4403 Placenta previa specified as without hemorrhage, third trimester: Secondary | ICD-10-CM | POA: Diagnosis not present

## 2015-04-29 DIAGNOSIS — O4413 Placenta previa with hemorrhage, third trimester: Secondary | ICD-10-CM | POA: Diagnosis present

## 2015-04-29 DIAGNOSIS — O365930 Maternal care for other known or suspected poor fetal growth, third trimester, not applicable or unspecified: Secondary | ICD-10-CM | POA: Diagnosis not present

## 2015-04-29 DIAGNOSIS — O09293 Supervision of pregnancy with other poor reproductive or obstetric history, third trimester: Secondary | ICD-10-CM

## 2015-04-29 DIAGNOSIS — O289 Unspecified abnormal findings on antenatal screening of mother: Secondary | ICD-10-CM

## 2015-04-29 LAB — POCT URINALYSIS DIP (DEVICE)
Bilirubin Urine: NEGATIVE
Glucose, UA: NEGATIVE mg/dL
HGB URINE DIPSTICK: NEGATIVE
Ketones, ur: NEGATIVE mg/dL
Leukocytes, UA: NEGATIVE
Nitrite: NEGATIVE
PROTEIN: NEGATIVE mg/dL
SPECIFIC GRAVITY, URINE: 1.015 (ref 1.005–1.030)
Urobilinogen, UA: 0.2 mg/dL (ref 0.0–1.0)
pH: 7 (ref 5.0–8.0)

## 2015-04-29 NOTE — Patient Instructions (Signed)

## 2015-04-29 NOTE — Progress Notes (Signed)
Patient having BPP today in MFM

## 2015-04-29 NOTE — Progress Notes (Signed)
Subjective:transferred from [place] with IUGR, suboxone use and placenta previa    Crystal Huang is a [id number] [redacted]w[redacted]d being seen today for her first obstetrical visit.  Her obstetrical history is significant for previa, IUGR and suboxone. Patient does intend to breast feed. Pregnancy history fully reviewed.  Patient reports no complaints.  Filed Vitals:   04/29/15 1400 04/29/15 1400  BP: 115/75   Pulse: 81   Temp: 98.1 F (36.7 C)   Height:   (1.702 m)  Weight: 133 lb 4.8 oz (60.464 kg)     HISTORY: OB History  Gravida Para Term Preterm AB SAB TAB Ectopic Multiple Living  0 4 4 0 0 0 4    # Outcome Date GA Lbr Len/2nd Weight Sex Delivery Anes PTL Lv  7 Current           6 Term 08/05/13 [redacted]w[redacted]d  6 lb 9 oz (2.977 kg) M Vag-Spont EPI  Y  5 Term 09/23/07 [redacted]w[redacted]d  5 lb 9 oz (2.523 kg) M Vag-Spont EPI  Y     Complications: Pre-eclampsia  4 SAB           3 SAB           2 SAB           1 SAB              Past Medical History  Diagnosis Date  . Drug abuse    Past Surgical History  Procedure Laterality Date  . Mouth surgery     Family History  Problem Relation Age of Onset  . Heart disease Mother   . Kidney disease Mother   . Cancer Maternal Grandmother      Exam    Uterus:     Pelvic Exam:                                    Skin: normal coloration and turgor, no rashes    Neurologic: oriented, normal mood   Extremities: normal strength, tone, and muscle mass           Neck no masses   Cardiovascular: regular rate and rhythm   Respiratory:  appears well, vitals normal, no respiratory distress, acyanotic, normal RR   Abdomen: soft, non-tender; bowel sounds normal; no masses,  no organomegaly   Urinary:        Assessment:    Pregnancy: [id number] Patient Active Problem List   Diagnosis Date Noted  . Prior poor obstetrical history in third trimester, antepartum   . Partial previa   . Low lying placenta with hemorrhage, antepartum   . Poor  fetal growth affecting management of mother in third trimester, antepartum   . Maternal drug dependence complicating pregnancy   . Poor fetal growth affecting management of mother in third trimester   . Drug abuse during pregnancy   . Placenta previa antepartum in third trimester   . Abnormal findings on antenatal screening   . High risk pregnancy due to maternal drug abuse   . Placenta previa antepartum in second trimester   . Abnormal MSAFP (maternal serum alpha-fetoprotein), decreased   . Abnormal maternal serum screening test   . Encounter for fetal anatomic survey         Plan:     Initial labs drawn. Prenatal vitamins. Problem list reviewed and updated. Genetic Screening discussed First Screen: were abnl.  Ultrasound discussed; fetal survey: results reviewed.  Follow up in 1 weeks.for scheduled CS per MFM 50% of 30 min visit spent on counseling and coordination of care.  CS 36 weeks, continue fetal testing   Crystal Huang 04/29/2015

## 2015-04-30 ENCOUNTER — Ambulatory Visit (HOSPITAL_COMMUNITY): Payer: Medicaid Other

## 2015-05-05 ENCOUNTER — Encounter (HOSPITAL_COMMUNITY): Payer: Self-pay

## 2015-05-05 ENCOUNTER — Ambulatory Visit (HOSPITAL_COMMUNITY): Payer: Medicaid Other

## 2015-05-05 ENCOUNTER — Encounter (HOSPITAL_COMMUNITY): Payer: Self-pay | Admitting: Anesthesiology

## 2015-05-05 ENCOUNTER — Encounter (HOSPITAL_COMMUNITY)
Admission: RE | Admit: 2015-05-05 | Discharge: 2015-05-05 | Disposition: A | Discharge status: Home / Self Care | Payer: Medicaid Other | Source: Ambulatory Visit | Attending: Obstetrics & Gynecology | Admitting: Obstetrics & Gynecology

## 2015-05-05 HISTORY — DX: Gastro-esophageal reflux disease without esophagitis: K21.9

## 2015-05-05 HISTORY — DX: Anemia, unspecified: D64.9

## 2015-05-05 LAB — CBC
HCT: 31.8 % — ABNORMAL LOW (ref 36.0–46.0)
HEMOGLOBIN: 10.5 g/dL — AB (ref 12.0–15.0)
MCH: 29.2 pg (ref 26.0–34.0)
MCHC: 33 g/dL (ref 30.0–36.0)
MCV: 88.6 fL (ref 78.0–100.0)
Platelets: 209 10*3/uL (ref 150–400)
RBC: 3.59 MIL/uL — AB (ref 3.87–5.11)
RDW: 12.8 % (ref 11.5–15.5)
WBC: 12.4 10*3/uL — ABNORMAL HIGH (ref 4.0–10.5)

## 2015-05-05 LAB — ABO/RH: ABO/RH(D): O POS

## 2015-05-05 MED ORDER — DEXTROSE 5 % IV SOLN
2.0000 g | INTRAVENOUS | Status: AC
  Administered 2015-05-06: 2 g via INTRAVENOUS
  Filled 2015-05-05: qty 2

## 2015-05-05 NOTE — Anesthesia Preprocedure Evaluation (Addendum)
Anesthesia Evaluation  Patient identified by MRN, date of birth, ID band Patient awake    Reviewed: Allergy & Precautions, NPO status , Patient's Chart, lab work & pertinent test results  Airway Mallampati: II  TM Distance: >3 FB Neck ROM: Full    Dental no notable dental hx. (+) Teeth Intact   Pulmonary Current Smoker,  breath sounds clear to auscultation  Pulmonary exam normal       Cardiovascular negative cardio ROS Normal cardiovascular examRhythm:Regular Rate:Normal     Neuro/Psych negative neurological ROS  negative psych ROS   GI/Hepatic GERD-  Medicated and Controlled,(+)     substance abuse  , On suboxone   Endo/Other  negative endocrine ROS  Renal/GU negative Renal ROS  negative genitourinary   Musculoskeletal negative musculoskeletal ROS (+)   Abdominal   Peds  Hematology  (+) anemia ,   Anesthesia Other Findings   Reproductive/Obstetrics (+) Pregnancy Placenta Previa IUGR 36 weeks                           Anesthesia Physical Anesthesia Plan  ASA: II  Anesthesia Plan: Spinal   Post-op Pain Management:    Induction:   Airway Management Planned: Natural Airway  Additional Equipment:   Intra-op Plan:   Post-operative Plan:   Informed Consent: I have reviewed the patients History and Physical, chart, labs and discussed the procedure including the risks, benefits and alternatives for the proposed anesthesia with the patient or authorized representative who has indicated his/her understanding and acceptance.     Plan Discussed with: CRNA, Anesthesiologist and Surgeon  Anesthesia Plan Comments: (No intrathecal narcotics)        Anesthesia Quick Evaluation

## 2015-05-05 NOTE — Patient Instructions (Addendum)
Your procedure is scheduled on:  May 06, 2015    Enter through the Main Entrance of [facility] at:  9:00 am   Pick up the phone at the desk and dial [phone].  Call this number if you have problems the morning of surgery: [phone].  Remember: Do NOT eat food: after midnight tonight  Do NOT drink clear liquids after:  Midnight tonight  Take these medicines the morning of surgery with a SIP OF WATER:  Subutex   Do NOT wear jewelry (body piercing), metal hair clips/bobby pins, or nail polish. Do NOT wear lotions, powders, or perfumes.  You may wear deoderant. Do NOT shave for 48 hours prior to surgery. Do NOT bring valuables to the hospital. Leave suitcase in car.  After surgery it may be brought to your room.  For patients admitted to the hospital, checkout time is 11:00 AM the day of discharge.

## 2015-05-06 ENCOUNTER — Encounter (HOSPITAL_COMMUNITY)
Admission: RE | Disposition: A | Discharge status: Home / Self Care | Payer: Self-pay | Source: Ambulatory Visit | Attending: Obstetrics & Gynecology

## 2015-05-06 ENCOUNTER — Inpatient Hospital Stay (HOSPITAL_COMMUNITY): Payer: Medicaid Other | Admitting: Anesthesiology

## 2015-05-06 ENCOUNTER — Encounter (HOSPITAL_COMMUNITY): Payer: Self-pay | Admitting: Anesthesiology

## 2015-05-06 ENCOUNTER — Inpatient Hospital Stay (HOSPITAL_COMMUNITY)
Admission: RE | Admit: 2015-05-06 | Discharge: 2015-05-09 | DRG: 765 | Disposition: A | Discharge status: Home / Self Care | Payer: Medicaid Other | Source: Ambulatory Visit | Attending: Obstetrics & Gynecology | Admitting: Obstetrics & Gynecology

## 2015-05-06 ENCOUNTER — Ambulatory Visit (HOSPITAL_COMMUNITY): Payer: Medicaid Other

## 2015-05-06 DIAGNOSIS — F1190 Opioid use, unspecified, uncomplicated: Secondary | ICD-10-CM | POA: Diagnosis present

## 2015-05-06 DIAGNOSIS — O99320 Drug use complicating pregnancy, unspecified trimester: Secondary | ICD-10-CM | POA: Diagnosis present

## 2015-05-06 DIAGNOSIS — O99334 Smoking (tobacco) complicating childbirth: Secondary | ICD-10-CM | POA: Diagnosis present

## 2015-05-06 DIAGNOSIS — F17210 Nicotine dependence, cigarettes, uncomplicated: Secondary | ICD-10-CM | POA: Diagnosis present

## 2015-05-06 DIAGNOSIS — O99324 Drug use complicating childbirth: Secondary | ICD-10-CM | POA: Diagnosis present

## 2015-05-06 DIAGNOSIS — O365930 Maternal care for other known or suspected poor fetal growth, third trimester, not applicable or unspecified: Secondary | ICD-10-CM | POA: Diagnosis present

## 2015-05-06 DIAGNOSIS — K219 Gastro-esophageal reflux disease without esophagitis: Secondary | ICD-10-CM | POA: Diagnosis present

## 2015-05-06 DIAGNOSIS — O4403 Placenta previa specified as without hemorrhage, third trimester: Principal | ICD-10-CM | POA: Diagnosis present

## 2015-05-06 DIAGNOSIS — Z809 Family history of malignant neoplasm, unspecified: Secondary | ICD-10-CM

## 2015-05-06 DIAGNOSIS — Z8249 Family history of ischemic heart disease and other diseases of the circulatory system: Secondary | ICD-10-CM

## 2015-05-06 DIAGNOSIS — Z98891 History of uterine scar from previous surgery: Secondary | ICD-10-CM

## 2015-05-06 DIAGNOSIS — Z3A36 36 weeks gestation of pregnancy: Secondary | ICD-10-CM | POA: Diagnosis present

## 2015-05-06 DIAGNOSIS — O9962 Diseases of the digestive system complicating childbirth: Secondary | ICD-10-CM | POA: Diagnosis present

## 2015-05-06 LAB — RPR: RPR Ser Ql: NONREACTIVE

## 2015-05-06 LAB — PREPARE RBC (CROSSMATCH)

## 2015-05-06 SURGERY — Surgical Case
Anesthesia: Spinal | Site: Abdomen

## 2015-05-06 MED ORDER — ONDANSETRON HCL 4 MG/2ML IJ SOLN
INTRAMUSCULAR | Status: DC | PRN
  Administered 2015-05-06: 4 mg via INTRAVENOUS

## 2015-05-06 MED ORDER — HYDROMORPHONE 0.3 MG/ML IV SOLN
INTRAVENOUS | Status: DC
  Administered 2015-05-06: 3.3 mg via INTRAVENOUS
  Administered 2015-05-06: 15:00:00 via INTRAVENOUS
  Administered 2015-05-06: 5 mL via INTRAVENOUS
  Administered 2015-05-07: 0.78 mg via INTRAVENOUS
  Administered 2015-05-07: 05:00:00 via INTRAVENOUS
  Administered 2015-05-07: 3.3 mg via INTRAVENOUS
  Filled 2015-05-06 (×3): qty 25

## 2015-05-06 MED ORDER — HYDROMORPHONE HCL 1 MG/ML IJ SOLN
INTRAMUSCULAR | Status: AC
  Administered 2015-05-06: 1 mg
  Filled 2015-05-06: qty 1

## 2015-05-06 MED ORDER — LACTATED RINGERS IV SOLN
Freq: Once | INTRAVENOUS | Status: AC
  Administered 2015-05-06: 10:00:00 via INTRAVENOUS

## 2015-05-06 MED ORDER — SCOPOLAMINE 1 MG/3DAYS TD PT72
MEDICATED_PATCH | TRANSDERMAL | Status: AC
  Administered 2015-05-06: 1.5 mg via TRANSDERMAL
  Filled 2015-05-06: qty 1

## 2015-05-06 MED ORDER — WITCH HAZEL-GLYCERIN EX PADS
1.0000 | MEDICATED_PAD | CUTANEOUS | Status: DC | PRN
Start: 2015-05-06 — End: 2015-05-09

## 2015-05-06 MED ORDER — SIMETHICONE 80 MG PO CHEW
80.0000 mg | CHEWABLE_TABLET | ORAL | Status: DC
  Administered 2015-05-07 – 2015-05-09 (×3): 80 mg via ORAL
  Filled 2015-05-06 (×3): qty 1

## 2015-05-06 MED ORDER — ONDANSETRON HCL 4 MG/2ML IJ SOLN: 4.0000 mg | Freq: Four times a day (QID) | INTRAMUSCULAR | Status: DC | PRN

## 2015-05-06 MED ORDER — MORPHINE SULFATE 0.5 MG/ML IJ SOLN
INTRAMUSCULAR | Status: AC
  Filled 2015-05-06: qty 10

## 2015-05-06 MED ORDER — LACTATED RINGERS IV SOLN
INTRAVENOUS | Status: DC
  Administered 2015-05-06 (×3): via INTRAVENOUS

## 2015-05-06 MED ORDER — MIDAZOLAM HCL 2 MG/2ML IJ SOLN
INTRAMUSCULAR | Status: AC
  Filled 2015-05-06: qty 2

## 2015-05-06 MED ORDER — MENTHOL 3 MG MT LOZG: 1.0000 | LOZENGE | OROMUCOSAL | Status: DC | PRN

## 2015-05-06 MED ORDER — KETAMINE HCL 10 MG/ML IJ SOLN
INTRAMUSCULAR | Status: DC | PRN
  Administered 2015-05-06 (×3): 2 mg via INTRAVENOUS
  Administered 2015-05-06: 4 mg via INTRAVENOUS
  Administered 2015-05-06: 5 mg via INTRAVENOUS

## 2015-05-06 MED ORDER — LANOLIN HYDROUS EX OINT: 1.0000 "application " | TOPICAL_OINTMENT | CUTANEOUS | Status: DC | PRN

## 2015-05-06 MED ORDER — FENTANYL CITRATE (PF) 100 MCG/2ML IJ SOLN
INTRAMUSCULAR | Status: AC
  Filled 2015-05-06: qty 2

## 2015-05-06 MED ORDER — TETANUS-DIPHTH-ACELL PERTUSSIS 5-2.5-18.5 LF-MCG/0.5 IM SUSP
0.5000 mL | Freq: Once | INTRAMUSCULAR | Status: DC
Start: 2015-05-07 — End: 2015-05-09

## 2015-05-06 MED ORDER — PHENYLEPHRINE 8 MG IN D5W 100 ML (0.08MG/ML) PREMIX OPTIME
INJECTION | INTRAVENOUS | Status: AC
  Filled 2015-05-06: qty 100

## 2015-05-06 MED ORDER — OXYTOCIN 40 UNITS IN LACTATED RINGERS INFUSION - SIMPLE MED: 62.5000 mL/h | INTRAVENOUS | Status: AC

## 2015-05-06 MED ORDER — OXYTOCIN 40 UNITS IN LACTATED RINGERS INFUSION - SIMPLE MED
INTRAVENOUS | Status: DC | PRN
  Administered 2015-05-06: 40 [IU] via INTRAVENOUS

## 2015-05-06 MED ORDER — SENNOSIDES-DOCUSATE SODIUM 8.6-50 MG PO TABS
2.0000 | ORAL_TABLET | ORAL | Status: DC
  Administered 2015-05-07 – 2015-05-09 (×3): 2 via ORAL
  Filled 2015-05-06 (×3): qty 2

## 2015-05-06 MED ORDER — BUPIVACAINE HCL (PF) 0.5 % IJ SOLN
INTRAMUSCULAR | Status: AC
  Filled 2015-05-06: qty 30

## 2015-05-06 MED ORDER — KETAMINE HCL 10 MG/ML IJ SOLN
INTRAMUSCULAR | Status: AC
  Filled 2015-05-06: qty 1

## 2015-05-06 MED ORDER — IBUPROFEN 600 MG PO TABS
600.0000 mg | ORAL_TABLET | Freq: Four times a day (QID) | ORAL | Status: DC
  Administered 2015-05-06 – 2015-05-09 (×11): 600 mg via ORAL
  Filled 2015-05-06 (×11): qty 1

## 2015-05-06 MED ORDER — PHENYLEPHRINE 8 MG IN D5W 100 ML (0.08MG/ML) PREMIX OPTIME
INJECTION | INTRAVENOUS | Status: DC | PRN
  Administered 2015-05-06: 60 ug/min via INTRAVENOUS

## 2015-05-06 MED ORDER — SCOPOLAMINE 1 MG/3DAYS TD PT72
1.0000 | MEDICATED_PATCH | Freq: Once | TRANSDERMAL | Status: DC
  Administered 2015-05-06: 1.5 mg via TRANSDERMAL

## 2015-05-06 MED ORDER — LACTATED RINGERS IV SOLN
INTRAVENOUS | Status: DC | PRN
  Administered 2015-05-06: 11:00:00 via INTRAVENOUS

## 2015-05-06 MED ORDER — PRENATAL MULTIVITAMIN CH
1.0000 | ORAL_TABLET | Freq: Every day | ORAL | Status: DC
  Administered 2015-05-07 – 2015-05-08 (×2): 1 via ORAL
  Filled 2015-05-06 (×2): qty 1

## 2015-05-06 MED ORDER — OXYTOCIN 10 UNIT/ML IJ SOLN
INTRAMUSCULAR | Status: AC
  Filled 2015-05-06: qty 4

## 2015-05-06 MED ORDER — SODIUM CHLORIDE 0.9 % IJ SOLN: 9.0000 mL | INTRAMUSCULAR | Status: DC | PRN

## 2015-05-06 MED ORDER — SODIUM CHLORIDE 0.9 % IJ SOLN
INTRAMUSCULAR | Status: AC
  Filled 2015-05-06: qty 10

## 2015-05-06 MED ORDER — MEPERIDINE HCL 25 MG/ML IJ SOLN: 6.2500 mg | INTRAMUSCULAR | Status: DC | PRN

## 2015-05-06 MED ORDER — BUPRENORPHINE HCL 8 MG SL SUBL
8.0000 mg | SUBLINGUAL_TABLET | Freq: Three times a day (TID) | SUBLINGUAL | Status: DC
  Administered 2015-05-06 – 2015-05-09 (×9): 8 mg via SUBLINGUAL
  Filled 2015-05-06 (×9): qty 1

## 2015-05-06 MED ORDER — DIBUCAINE 1 % RE OINT: 1.0000 "application " | TOPICAL_OINTMENT | RECTAL | Status: DC | PRN

## 2015-05-06 MED ORDER — ONDANSETRON HCL 4 MG/2ML IJ SOLN
INTRAMUSCULAR | Status: AC
  Filled 2015-05-06: qty 2

## 2015-05-06 MED ORDER — DIPHENHYDRAMINE HCL 25 MG PO CAPS: 25.0000 mg | ORAL_CAPSULE | Freq: Four times a day (QID) | ORAL | Status: DC | PRN

## 2015-05-06 MED ORDER — NALOXONE HCL 0.4 MG/ML IJ SOLN: 0.4000 mg | INTRAMUSCULAR | Status: DC | PRN

## 2015-05-06 MED ORDER — METOCLOPRAMIDE HCL 5 MG/ML IJ SOLN: 10.0000 mg | Freq: Once | INTRAMUSCULAR | Status: DC | PRN

## 2015-05-06 MED ORDER — 0.9 % SODIUM CHLORIDE (POUR BTL) OPTIME
TOPICAL | Status: DC | PRN
  Administered 2015-05-06: 1000 mL

## 2015-05-06 MED ORDER — LACTATED RINGERS IV SOLN
INTRAVENOUS | Status: DC
  Administered 2015-05-06 – 2015-05-07 (×4): via INTRAVENOUS

## 2015-05-06 MED ORDER — HYDROMORPHONE HCL 1 MG/ML IJ SOLN
0.2500 mg | INTRAMUSCULAR | Status: DC | PRN
  Administered 2015-05-06 (×4): 0.5 mg via INTRAVENOUS

## 2015-05-06 MED ORDER — PHENYLEPHRINE 40 MCG/ML (10ML) SYRINGE FOR IV PUSH (FOR BLOOD PRESSURE SUPPORT)
PREFILLED_SYRINGE | INTRAVENOUS | Status: AC
  Filled 2015-05-06: qty 10

## 2015-05-06 MED ORDER — SIMETHICONE 80 MG PO CHEW
80.0000 mg | CHEWABLE_TABLET | Freq: Three times a day (TID) | ORAL | Status: DC
Start: 2015-05-06 — End: 2015-05-09
  Administered 2015-05-06 – 2015-05-09 (×5): 80 mg via ORAL
  Filled 2015-05-06 (×7): qty 1

## 2015-05-06 MED ORDER — BUPIVACAINE IN DEXTROSE 0.75-8.25 % IT SOLN
INTRATHECAL | Status: DC | PRN
  Administered 2015-05-06: 1.8 mL via INTRATHECAL

## 2015-05-06 MED ORDER — HYDROMORPHONE HCL 1 MG/ML IJ SOLN
INTRAMUSCULAR | Status: AC
  Administered 2015-05-06: 0.5 mg via INTRAVENOUS
  Filled 2015-05-06: qty 1

## 2015-05-06 MED ORDER — ZOLPIDEM TARTRATE 5 MG PO TABS: 5.0000 mg | ORAL_TABLET | Freq: Every evening | ORAL | Status: DC | PRN

## 2015-05-06 MED ORDER — MIDAZOLAM HCL 2 MG/2ML IJ SOLN
INTRAMUSCULAR | Status: DC | PRN
  Administered 2015-05-06: 2 mg via INTRAVENOUS

## 2015-05-06 MED ORDER — SIMETHICONE 80 MG PO CHEW: 80.0000 mg | CHEWABLE_TABLET | ORAL | Status: DC | PRN

## 2015-05-06 MED ORDER — HYDROMORPHONE HCL 1 MG/ML IJ SOLN: 1.0000 mg | Freq: Once | INTRAMUSCULAR | Status: DC

## 2015-05-06 MED ORDER — ACETAMINOPHEN 325 MG PO TABS: 650.0000 mg | ORAL_TABLET | ORAL | Status: DC | PRN

## 2015-05-06 MED ORDER — ACETAMINOPHEN 10 MG/ML IV SOLN
1000.0000 mg | Freq: Once | INTRAVENOUS | Status: AC
  Administered 2015-05-06: 1000 mg via INTRAVENOUS
  Filled 2015-05-06: qty 100

## 2015-05-06 MED ORDER — BUPIVACAINE HCL (PF) 0.5 % IJ SOLN
INTRAMUSCULAR | Status: DC | PRN
  Administered 2015-05-06: 24 mL

## 2015-05-06 SURGICAL SUPPLY — 35 items
BENZOIN TINCTURE PRP APPL 2/3 (GAUZE/BANDAGES/DRESSINGS) ×3 IMPLANT
CLAMP CORD UMBIL (MISCELLANEOUS) IMPLANT
CLOSURE WOUND 1/2 X4 (GAUZE/BANDAGES/DRESSINGS) ×1
CLOTH BEACON ORANGE TIMEOUT ST (SAFETY) ×3 IMPLANT
DRAPE SHEET LG 3/4 BI-LAMINATE (DRAPES) IMPLANT
DRSG OPSITE POSTOP 4X10 (GAUZE/BANDAGES/DRESSINGS) ×3 IMPLANT
DURAPREP 26ML APPLICATOR (WOUND CARE) ×3 IMPLANT
ELECT REM PT RETURN 9FT ADLT (ELECTROSURGICAL) ×3
ELECTRODE REM PT RTRN 9FT ADLT (ELECTROSURGICAL) ×1 IMPLANT
EXTRACTOR VACUUM M CUP 4 TUBE (SUCTIONS) IMPLANT
EXTRACTOR VACUUM M CUP 4' TUBE (SUCTIONS)
GLOVE BIOGEL PI IND STRL 7.0 (GLOVE) ×2 IMPLANT
GLOVE BIOGEL PI INDICATOR 7.0 (GLOVE) ×4
GLOVE ECLIPSE 7.0 STRL STRAW (GLOVE) ×3 IMPLANT
GOWN STRL REUS W/TWL LRG LVL3 (GOWN DISPOSABLE) ×6 IMPLANT
KIT ABG SYR 3ML LUER SLIP (SYRINGE) IMPLANT
NEEDLE HYPO 22GX1.5 SAFETY (NEEDLE) ×3 IMPLANT
NEEDLE HYPO 25X5/8 SAFETYGLIDE (NEEDLE) ×3 IMPLANT
NS IRRIG 1000ML POUR BTL (IV SOLUTION) ×3 IMPLANT
PACK C SECTION WH (CUSTOM PROCEDURE TRAY) ×3 IMPLANT
PAD ABD 7.5X8 STRL (GAUZE/BANDAGES/DRESSINGS) ×3 IMPLANT
PAD OB MATERNITY 4.3X12.25 (PERSONAL CARE ITEMS) ×3 IMPLANT
RTRCTR C-SECT PINK 25CM LRG (MISCELLANEOUS) IMPLANT
SPONGE GAUZE 4X4 12PLY STER LF (GAUZE/BANDAGES/DRESSINGS) ×3 IMPLANT
STRIP CLOSURE SKIN 1/2X4 (GAUZE/BANDAGES/DRESSINGS) ×2 IMPLANT
SUT PDS AB 0 CT1 27 (SUTURE) IMPLANT
SUT PDS AB 0 CTX 36 PDP370T (SUTURE) IMPLANT
SUT PLAIN 2 0 XLH (SUTURE) IMPLANT
SUT VIC AB 0 CT1 36 (SUTURE) ×6 IMPLANT
SUT VIC AB 0 CTX 36 (SUTURE) ×4
SUT VIC AB 0 CTX36XBRD ANBCTRL (SUTURE) ×2 IMPLANT
SUT VIC AB 4-0 KS 27 (SUTURE) ×3 IMPLANT
SYR CONTROL 10ML LL (SYRINGE) ×3 IMPLANT
TOWEL OR 17X24 6PK STRL BLUE (TOWEL DISPOSABLE) ×3 IMPLANT
TRAY FOLEY CATH SILVER 14FR (SET/KITS/TRAYS/PACK) ×3 IMPLANT

## 2015-05-06 NOTE — Anesthesia Procedure Notes (Addendum)
Spinal Patient location during procedure: OR Staffing Performed by: other anesthesia staff  Preanesthetic Checklist Completed: patient identified, site marked, surgical consent, pre-op evaluation, timeout performed, IV checked, risks and benefits discussed and monitors and equipment checked Spinal Block Patient position: sitting Prep: ChloraPrep Patient monitoring: continuous pulse ox, blood pressure and heart rate Approach: midline Injection technique: single-shot Needle Needle type: Sprotte  Needle gauge: 24 G Needle length: 9 cm Additional Notes Functioning IV was confirmed and monitors were applied. Sterile prep and drape, including hand hygiene and sterile gloves were used. The patient was positioned and the spine was prepped. The skin was anesthetized with lidocaine.  Free flow of clear CSF was obtained prior to injecting local anesthetic into the CSF.  The spinal needle aspirated freely following injection.  The needle was carefully withdrawn.  The patient tolerated the procedure well.  I did not perform this spinal. I observed the student CRNA Collier Salina) performing this spinal. I never took over or placed a needle in her back. -MJudd

## 2015-05-06 NOTE — Anesthesia Postprocedure Evaluation (Signed)
  Anesthesia Post-op Note  Patient: Crystal Huang  Procedure(s) Performed: Procedure(s) with comments: CESAREAN SECTION (N/A) - Requested 05/06/15 @ 10:30a  Patient Location: PACU  Anesthesia Type:Spinal  Level of Consciousness: awake, alert  and oriented  Airway and Oxygen Therapy: Patient Spontanous Breathing  Post-op Pain: mild  Post-op Assessment: Post-op Vital signs reviewed, Patient's Cardiovascular Status Stable, Respiratory Function Stable, Patent Airway, No signs of Nausea or vomiting, Pain level controlled, No headache, No backache, Spinal receding and Patient able to bend at knees LLE Motor Response: Purposeful movement LLE Sensation: Decreased, Tingling RLE Motor Response: Purposeful movement RLE Sensation: Decreased, Tingling      Post-op Vital Signs: Reviewed and stable  Last Vitals:  Filed Vitals:   05/06/15 1415  BP: 104/65  Pulse: 62  Temp:   Resp: 18    Complications: No apparent anesthesia complications

## 2015-05-06 NOTE — Op Note (Signed)
Earlie Server PROCEDURE DATE: 05/06/2015  PREOPERATIVE DIAGNOSES: Intrauterine pregnancy at [redacted]w[redacted]d weeks gestation; placenta previa and IUGR  POSTOPERATIVE DIAGNOSES: The same  PROCEDURE: Primary Low Transverse Cesarean Section  SURGEON:  Dr. Jaynie Collins  ASSISTANT:  Federico Flake  ANESTHESIOLOGIST: Dr. Yetta Barre  INDICATIONS: Crystal Huang is a 24 y.o. [id number] at [redacted]w[redacted]d here for cesarean section secondary to the indications listed under preoperative diagnoses; please see preoperative note for further details.  The risks of cesarean section were discussed with the patient including but were not limited to: bleeding which may require transfusion or reoperation; infection which may require antibiotics; injury to bowel, bladder, ureters or other surrounding organs; injury to the fetus; need for additional procedures including hysterectomy in the event of a life-threatening hemorrhage; placental abnormalities wth subsequent pregnancies, incisional problems, thromboembolic phenomenon and other postoperative/anesthesia complications.   The patient concurred with the proposed plan, giving informed written consent for the procedure.    FINDINGS:  Viable female infant in cephalic presentation.  Apgars 9 and 9. Weight 4lb2oz. Clear amniotic fluid.  Intact placenta previa, three vessel cord.  Normal uterus, fallopian tubes and ovaries bilaterally.  ANESTHESIA: Spinal INTRAVENOUS FLUIDS: 2400 ml ESTIMATED BLOOD LOSS: 600 ml URINE OUTPUT:  700 ml SPECIMENS: Placenta sent to pathology COMPLICATIONS: None immediate  PROCEDURE IN DETAIL:  The patient preoperatively received intravenous antibiotics and had sequential compression devices applied to her lower extremities.  She was then taken to the operating room where spinal anesthesia was administered and was found to be adequate. She was then placed in a dorsal supine position with a leftward tilt, and prepped and draped in a sterile manner.  A  foley catheter was placed into her bladder and attached to constant gravity.  After an adequate timeout was performed, a Pfannenstiel skin incision was made with scalpel and carried through to the underlying layer of fascia. The fascia was incised in the midline, and this incision was extended bilaterally using the Mayo scissors.  Kocher clamps were applied to the superior aspect of the fascial incision and the underlying rectus muscles were dissected off bluntly. A similar process was carried out on the inferior aspect of the fascial incision. The rectus muscles were separated in the midline bluntly and the peritoneum was entered bluntly. Attention was turned to the lower uterine segment where a low transverse hysterotomy was made with a scalpel and extended bilaterally bluntly.  The infant was successfully delivered, the cord was clamped and cut after 1 minute and the infant was handed over to awaiting neonatology team. Uterine massage was then administered, and the placenta delivered intact with a three-vessel cord. The uterus was then cleared of clot and debris.  The hysterotomy was closed with 0 Vicryl in a running locked fashion, and an imbricating layer was also placed with 0 Vicryl.  Figure-of-eight serosal stitches were placed to help with hemostasis.  The pelvis was cleared of all clot and debris. Hemostasis was confirmed on all surfaces.  The peritoneum and the muscles were reapproximated using 0 Vicryl interrupted stitches. The fascia was then closed using 0 Vicryl in a running fashion.  20 ml of 0.5% Marcaine was injected subcutaneously around the incision.  The skin was closed with a 4-0 Vicryl subcuticular stitch. The patient tolerated the procedure well. Sponge, lap, instrument and needle counts were correct x 2.  She was taken to the recovery room in stable condition.    Federico Flake, MD  Family Medicine, OB Fellow [facility]  [place]

## 2015-05-06 NOTE — Consult Note (Signed)
Neonatology Note:   Attendance at C-section:    I was asked by Dr. Macon Large to attend this primary C/S at 36 0/7 weeks due to placenta previa and known IUGR infant. The mother is a G7P2A4 O pos, GBS not done with cigarette smoking 1/4 pack/day and a history of substance abuse, on Subutex. ROM at delivery, fluid clear. Infant vigorous with good spontaneous cry and tone. Needed only minimal bulb suctioning. Ap 9/9. Lungs clear to ausc in DR. Seen by mother briefly, then transported to NICU for further care, due to size.   Doretha Sou, MD

## 2015-05-06 NOTE — Lactation Note (Signed)
This note was copied from the chart of Crystal Huang. Lactation Consultation Note  Patient Name: Crystal Kavitha Lansdale ZOXWR'U Date: 05/06/2015 Reason for consult: Initial assessment;NICU baby;Infant < 6lbs;Late preterm infant RN set up DEBP but Mom not pumping yet. Demonstrated hand expression to Mom and received approx 2 ml of colostrum. Started Mom pumping on preemie setting and advised to pump every 3 hours for 15 minutes, then to hand express for 5 minutes.  Stressed importance of providing breast milk to her baby to minimize withdrawal symptoms from Mom using Subutex. Reviewed pump/storage guidelines for NICU and importance of labeling milk. NICU booklet given for Mom to review. Mom reports she tried to BF her older boys but with the 1st baby was told to pump after 2 weeks stating the baby had breast milk jaundice and with the other boy she did not produce enough milk with BF/pumping. Encouraged to do STS when she is able and to ask for assist with latch when baby able to go to the breast.   Maternal Data Has patient been taught Hand Expression?: Yes Does the patient have breastfeeding experience prior to this delivery?: Yes  Feeding    LATCH Score/Interventions                      Lactation Tools Discussed/Used Tools: Pump Breast pump type: Double-Electric Breast Pump   Consult Status Consult Status: Follow-up Date: 05/07/15 Follow-up type: In-patient    Alfred Levins 05/06/2015, 5:32 PM

## 2015-05-06 NOTE — H&P (Signed)
Obstetric Preoperative History and Physical  Crystal Huang is a 24 y.o. [phone] with IUP at [redacted]w[redacted]d presenting for presenting for scheduled cesarean section for previa and IUGR.  No acute concerns.   Prenatal Course Source of Care: [facility] at 36 weeks  Pregnancy complications or risks: Patient Active Problem List   Diagnosis Date Noted  . Poor fetal growth affecting management of mother in third trimester, antepartum   . Maternal drug dependence complicating pregnancy   . Placenta previa antepartum in third trimester   . Abnormal MSAFP (maternal serum alpha-fetoprotein), decreased   She plans to breastfeed She is considering Mirena IUD for postpartum contraception.   Prenatal labs and studies: ABO, Rh: --/--/O POS, O POS (07/26 1500) Antibody: NEG (07/26 1500) Rubella: Immune (01/19 0000) RPR: Non Reactive (07/26 1500)  HBsAg: Negative (01/19 0000)  HIV: Non-reactive (01/19 0000)  GBS: Not done 1 hr Glucola  Normal as per patient Genetic screening normal Panorama Anatomy US normal  Past Medical History  Diagnosis Date  . Drug abuse   . GERD (gastroesophageal reflux disease)   . Anemia     has had but resoved     Past Surgical History  Procedure Laterality Date  . Mouth surgery    . Wisdom tooth extraction      OB History  Gravida Para Term Preterm AB SAB TAB Ectopic Multiple Living  0 4 4 0 0 0 2    # Outcome Date GA Lbr Len/2nd Weight Sex Delivery Anes PTL Lv  7 Current           6 Term 08/05/13 [redacted]w[redacted]d  6 lb 9 oz (2.977 kg) M Vag-Spont EPI  Y  5 Term 09/23/07 [redacted]w[redacted]d  5 lb 9 oz (2.523 kg) M Vag-Spont EPI  Y     Complications: Pre-eclampsia  4 SAB           3 SAB           2 SAB           1 SAB               History   Social History  . Marital Status: Married    Spouse Name: N/A  . Number of Children: N/A  . Years of Education: N/A   Social History Main Topics  . Smoking status: Current Every Day Smoker -- 0.25 packs/day for 10 years  .  Smokeless tobacco: Never Used  . Alcohol Use: No  . Drug Use: No     Comment: hx narcotic use, taking suboxone  . Sexual Activity: Not Currently   Other Topics Concern  . None   Social History Narrative   ** Merged History Encounter **        Family History  Problem Relation Age of Onset  . Heart disease Mother   . Kidney disease Mother   . Cancer Maternal Grandmother     Prescriptions prior to admission  Medication Sig Dispense Refill Last Dose  . acetaminophen (TYLENOL) 500 MG tablet Take 1,000 mg by mouth every 6 (six) hours as needed for headache.     . buprenorphine (SUBUTEX) 8 MG SUBL SL tablet Place 8 mg under the tongue 3 (three) times daily.   05/06/2015 at 0800  . calcium carbonate (TUMS - DOSED IN MG ELEMENTAL CALCIUM) 500 MG chewable tablet Chew 1 tablet by mouth 2 (two) times daily as needed for indigestion or heartburn.     Marland Kitchen  Prenatal Vit-Fe Fumarate-FA (PRENATAL VITAMIN PO) Take by mouth.   05/05/2015 at Unknown time  . aspirin-acetaminophen-caffeine (EXCEDRIN MIGRAINE) 250-250-65 MG per tablet Take 2 tablets by mouth every 6 (six) hours as needed for headache.    Taking    Allergies  Allergen Reactions  . Tape Other (See Comments)    blisters    Review of Systems: Negative except for what is mentioned in HPI.  Physical Exam: BP 118/56 mmHg  Pulse 92  Temp(Src) 98.2 F (36.8 C) (Oral)  Resp 20  SpO2 100%  LMP 09/09/2014 FHR by Doppler: 136 bpm CONSTITUTIONAL: Well-developed, well-nourished female in no acute distress.  HENT:  Normocephalic, atraumatic, External right and left ear normal. Oropharynx is clear and moist EYES: Conjunctivae and EOM are normal. Pupils are equal, round, and reactive to light. No scleral icterus.  NECK: Normal range of motion, supple, no masses SKIN: Skin is warm and dry. No rash noted. Not diaphoretic. No erythema. No pallor. NEUROLGIC: Alert and oriented to person, place, and time. Normal reflexes, muscle tone coordination.  No cranial nerve deficit noted. PSYCHIATRIC: Normal mood and affect. Normal behavior. Normal judgment and thought content. CARDIOVASCULAR: Normal heart rate noted, regular rhythm RESPIRATORY: Effort and breath sounds normal, no problems with respiration noted ABDOMEN: Soft, nontender, nondistended, gravid.  PELVIC: Deferred MUSCULOSKELETAL: Normal range of motion. No edema and no tenderness. 2+ distal pulses.   Pertinent Labs/Studies:   Results for orders placed or performed during the hospital encounter of 05/06/15 (from the past 72 hour(s))  ABO/Rh     Status: None   Collection Time: 05/05/15  3:00 PM  Result Value Ref Range   ABO/RH(D) O POS   Prepare RBC (crossmatch)     Status: None   Collection Time: 05/06/15 10:16 AM  Result Value Ref Range   Order Confirmation ORDER PROCESSED BY BLOOD BANK     Assessment and Plan :Crystal Huang is a 24 y.o. [id number] at [redacted]w[redacted]d being admitted being admitted for scheduled cesarean section for placenta previa and IUGR. The risks of cesarean section discussed with the patient included but were not limited to: bleeding which may require transfusion or reoperation; infection which may require antibiotics; injury to bowel, bladder, ureters or other surrounding organs; injury to the fetus; need for additional procedures including hysterectomy in the event of a life-threatening hemorrhage; placental abnormalities such as repeat previa or accreta wth subsequent pregnancies, incisional problems, thromboembolic phenomenon and other postoperative/anesthesia complications. The patient concurred with the proposed plan, giving informed written consent for the procedure. Patient has been NPO since last night she will remain NPO for procedure. Anesthesia and OR aware. Preoperative prophylactic antibiotics and SCDs ordered on call to the OR. To OR when ready.    Jaynie Collins, MD, FACOG  Attending Obstetrician & Gynecologist  Faculty Practice, [facility]

## 2015-05-06 NOTE — Transfer of Care (Signed)
Immediate Anesthesia Transfer of Care Note  Patient: Crystal Huang  Procedure(s) Performed: Procedure(s) with comments: CESAREAN SECTION (N/A) - Requested 05/06/15 @ 10:30a  Patient Location: PACU  Anesthesia Type:Spinal  Level of Consciousness: awake, alert  and oriented  Airway & Oxygen Therapy: Patient Spontanous Breathing  Post-op Assessment: Report given to RN and Post -op Vital signs reviewed and stable  Post vital signs: Reviewed and stable  Last Vitals:  Filed Vitals:   05/06/15 0927  BP: 118/56  Pulse: 92  Temp: 36.8 C  Resp: 20    Complications: No apparent anesthesia complications

## 2015-05-07 ENCOUNTER — Encounter (HOSPITAL_COMMUNITY): Payer: Self-pay | Admitting: Obstetrics & Gynecology

## 2015-05-07 ENCOUNTER — Encounter: Payer: Self-pay | Admitting: Obstetrics & Gynecology

## 2015-05-07 LAB — CBC
HCT: 24.3 % — ABNORMAL LOW (ref 36.0–46.0)
HEMOGLOBIN: 8.1 g/dL — AB (ref 12.0–15.0)
MCH: 29.7 pg (ref 26.0–34.0)
MCHC: 33.3 g/dL (ref 30.0–36.0)
MCV: 89 fL (ref 78.0–100.0)
PLATELETS: 179 10*3/uL (ref 150–400)
RBC: 2.73 MIL/uL — AB (ref 3.87–5.11)
RDW: 13 % (ref 11.5–15.5)
WBC: 13 10*3/uL — ABNORMAL HIGH (ref 4.0–10.5)

## 2015-05-07 NOTE — Progress Notes (Signed)
Subjective: Postpartum Day 1: Cesarean Delivery fir Pl Previa,  Patient reports incisional pain and tolerating PO.  Voiding ; used the PCA a lot overnite, "saved my life" comfortable now, taking suboxone, which is received from a clinic here in [place], where she will continue care. Suboxone use x 3 yrs.  Objective: Vital signs in last 24 hours: Temp:  [97.6 F (36.4 C)-98.6 F (37 C)] 98.6 F (37 C) (07/28 0411) Pulse Rate:  [53-92] 53 (07/28 0411) Resp:  [12-36] 14 (07/28 0633) BP: (104-120)/(56-79) 115/73 mmHg (07/28 0411) SpO2:  [99 %-100 %] 100 % (07/28 1610)  Physical Exam:  General: alert and no distress Lochia: appropriate Uterine Fundus: firm at u-2 Incision: no significant drainage, dressing fairly saturated, will have nursing change DVT Evaluation: No evidence of DVT seen on physical exam.   Recent Labs  05/05/15 1500 05/07/15 0536  HGB 10.5* 8.1*  HCT 31.8* 24.3*    Assessment/Plan: Status post Cesarean section. Doing well postoperatively. Hx Suboxone use, will need to continue, but can d/c PCA.  Continue current care,with pt to stay in hospital likely 72 hrs due to travel of 1 hr each way, and baby will be in NICU for several days (4#2").. Breast feeding / BC -Mirena IUD/ followup upon d/c at [facility] 05/07/2015, 7:40 AM

## 2015-05-07 NOTE — Addendum Note (Signed)
Addendum  created 05/07/15 0815 by Shanon Payor, CRNA   Modules edited: Notes Section   Notes Section:  File: [id number]

## 2015-05-07 NOTE — Clinical Social Work Maternal (Signed)
CLINICAL SOCIAL WORK MATERNAL/CHILD NOTE  Patient Details  Name: Crystal Huang MRN: [id number] Date of Birth: 12-Feb-1991  Date:  05/07/2015  Clinical Social Worker Initiating Note:  Lucita Ferrara, LCSW Date/ Time Initiated:  05/07/15/1100     Child's Name:  Unnamed at time of assessment   Legal Guardian:  Urban Gibson and Kory Panjwani (parents)  Need for Interpreter:  None   Date of Referral:  05/06/15     Reason for Referral:  NICU, MOB is currently prescribed Subutex  Referral Source:  NICU   Address:  [place], [place] [address]  Phone number:  [phone]   Household Members:  Minor Children, Spouse   Natural Supports (not living in the home):  Extended Family, Immediate Family   Professional Supports: None   Employment: Homemaker   Type of Work:   FOB is employed, works 3rd shift.  Education:    N/A  Museum/gallery curator Resources:  Medicaid   Other Resources:  Physicist, medical , [place]   Cultural/Religious Considerations Which May Impact Care:  None reported  Strengths:  Ability to meet basic needs , Pediatrician chosen , Home prepared for child    Risk Factors/Current Problems:  1) Infant at risk for NAS due to exposure to subutex.   Cognitive State:  Able to Concentrate , Alert , Linear Thinking , Goal Oriented    Mood/Affect:  Calm , Comfortable , Euthymic    CSW Assessment:  CSW met with MOB due to infant's admission to the NICU.  MOB presented as easily engaged and receptive to the visit.  At beginning of assessment, she presented as overwhelmed, but she reported feeling much better after talking with CSW.  MOB displayed a full range in affect and was noted to be in a pleasant mood.   CSW provided emotional support as MOB reflected upon her first C-section, the infant's NICU admission, and now sadness since she has spent limited time with the infant since she has born. She discussed frustration since it has been difficult for her to visit due to her own medical  needs and she is unsure of the exact health status of the infant.  MOB shared that she is interested in attending rounds in order to gain more insight and awareness of her infant's health, and discussed belief that she will feel better once she receives more information on how the infant is doing.   MOB stated that she is anticipating that she will be discharged prior to the infant, and she voiced normative feelings associated with an extended NICU admission.  She shared that the FOB works in [place], and she has already thought about how she can travel with him in order to visit more.  MOB shared that her family has agreed to help with her other two children so that she can spend more time at the hospital with the infant.    Per MOB, she has a history of receiving prescriptions for pain medication. She expressed regret for making "bad decisions", and discussed how after she received the pain medication, she sought out pain medication without a rx.  MOB shared that she then learned about subutex, and started to buy it without a prescription.  She discussed that she then gained insight on need to receive professional support to assist her to decrease dose, which led to her participating in a subutex clinic starting more than 2 years ago.  Per MOB, Duwaine Maxin is her substance abuse counselor, and Dr. Franchot Mimes prescribes her subutex.  MOB openly discussed her desire to discontinue use since she does not want to become dependent for life, but reflected upon previous circumstances and then her pregnancies that have complicated the discontinuation. MOB acknowledged that she is persistent and motivated to discontinue use in the future, and she shared that she is hopeful that someday she will no longer need the medication. MOB discussed at length the ongoing stigma she feels from others secondary to use, and shared that she wished other people could see that she is not a bad person or bad mother.   MOB denied history  of depression, anxiety, or perinatal mood and anxiety disorders. MOB shared that she can see how it is common for NICU parents to experience symptoms due to how she is currently feeling and how she anticipates to feel postpartum. She discussed belief that "you can only take so much".  MOB discussed having "no difficulty" reaching out to her medical provider if she notes symptoms, and acknowledged the ongoing availability of the NICU CSW to provide support.   CSW Plan/Description:   1)Patient/Family Education: Perinatal mood and anxiety disorders 2)Referrals: [facility] 3)No Further Intervention Required/No Barriers to Discharge    Sharyl Nimrod 05/07/2015, 3:18 PM

## 2015-05-07 NOTE — Anesthesia Postprocedure Evaluation (Signed)
  Anesthesia Post-op Note  Patient: Crystal Huang  Procedure(s) Performed: Procedure(s) with comments: CESAREAN SECTION (N/A) - Requested 05/06/15 @ 10:30a  Patient Location: Women's Unit  Anesthesia Type:Spinal  Level of Consciousness: awake, alert  and oriented  Airway and Oxygen Therapy: Patient Spontanous Breathing  Post-op Pain: none  Post-op Assessment: Post-op Vital signs reviewed, Patient's Cardiovascular Status Stable, Respiratory Function Stable, No signs of Nausea or vomiting, Adequate PO intake, Pain level controlled, No headache and No backache no residual numbness, moving legs   Post-op Vital Signs: Reviewed and stable  Last Vitals:  Filed Vitals:   05/07/15 0633  BP:   Pulse:   Temp:   Resp: 14    Complications: No apparent anesthesia complications

## 2015-05-08 NOTE — Lactation Note (Signed)
This note was copied from the chart of Crystal Margaretmary Prisk. Lactation Consultation Note  Patient Name: Crystal Huang ZOXWR'U Date: 05/08/2015 Reason for consult: Follow-up assessment;NICU baby NICU baby 68 hours old. Mom reports that she is having difficulty pumping with one side of the DEBP. Assisted mom to get pump working properly. Mom states that she is getting colostrum and has taken several bottles to baby in NICU. Enc mom to offer STS and attempts at breast as baby able. Enc mom to continue pumping 8 times/24 hours for 15 minutes and hand express afterwards. Mom aware of pumping rooms in NICU. Enc mom to call for assistance as needed.   Maternal Data    Feeding Feeding Type: Breast Milk Nipple Type: Slow - flow Length of feed: 5 min  LATCH Score/Interventions                      Lactation Tools Discussed/Used     Consult Status Consult Status: Follow-up Date: 05/09/15 Follow-up type: In-patient    Geralynn Ochs 05/08/2015, 3:33 PM

## 2015-05-08 NOTE — Progress Notes (Signed)
Post Partum Day 2 Subjective:  Crystal Huang is a 24 y.o. [phone] [redacted]w[redacted]d s/p pLTCS for placenta previa.  No acute events overnight.  Pt denies problems with ambulating, voiding or po intake.  She denies nausea or vomiting.  Pain is well controlled.  She has had flatus. She has not had bowel movement.  Lochia Small.  Plan for birth control is IUD.  Method of Feeding: bottle  Objective: Blood pressure 118/62, pulse 51, temperature 98.3 F (36.8 C), temperature source Oral, resp. rate 18, last menstrual period 09/09/2014, SpO2 100 %, unknown if currently breastfeeding.  Physical Exam:  General: alert, cooperative and no distress Lochia:normal flow Chest: respirations normal; no distress Abdomen: soft, nontender, incision is non-draining and w/o surrounding erythema or tenderness Uterine Fundus: firm DVT Evaluation: No evidence of DVT seen on physical exam. Extremities: no edema   Recent Labs  05/05/15 1500 05/07/15 0536  HGB 10.5* 8.1*  HCT 31.8* 24.3*    Assessment/Plan:  ASSESSMENT: Crystal Huang is a 24 y.o. [phone] [redacted]w[redacted]d s/p pLTCS for placenta previa; baby girl w/ IUGR currently in NICU. Mother is stable and ready for d/c, but unsure about leaving baby as she lives an hour away. She was encouraged to discuss baby status w/ NICU team and then decide on d/c this afternoon or tomorrow. Will continue subutex and stool softeners.  Plan for discharge tomorrow   LOS: 2 days   Lowanda Foster 05/08/2015, 7:47 AM

## 2015-05-09 LAB — TYPE AND SCREEN
ABO/RH(D): O POS
Antibody Screen: NEGATIVE
Unit division: 0
Unit division: 0

## 2015-05-09 MED ORDER — IBUPROFEN 600 MG PO TABS: 600.0000 mg | ORAL_TABLET | Freq: Four times a day (QID) | ORAL | Status: AC

## 2015-05-09 MED ORDER — OXYCODONE-ACETAMINOPHEN 5-325 MG PO TABS: 1.0000 | ORAL_TABLET | ORAL | Status: DC | PRN

## 2015-05-09 NOTE — Plan of Care (Signed)
Problem: Phase II Progression Outcomes Goal: Incision intact & without signs/symptoms of infection Outcome: Completed/Met Date Met:  05/09/15 Has pressure drsg with old blood noted.Old pressure drsg removed for patient to shower.New Honeycomb drsg applied.Incision line CDI with steri strips that have old drainage on them.New honeycomb applied and site care discussed.

## 2015-05-09 NOTE — Lactation Note (Signed)
Lactation Consultation Note  Patient Name: Crystal Huang Date: 05/09/2015   Patient is expressing 60 ml of transitional milk. Breast are full and soften with pumping. Mother is visiting baby frequently in NICU and plans to use the DEBP when she returns for visits post discharge. Mother continues to take Subutex 8 mg TID. Patient is committed to pumping breastmilk for her baby in NICU and is aware of the benefits to of baby getting her milk to help with symptoms of withdrawal. Mother has not [facility] with [facility] but plans to call on Monday to register for services so she can get a DEBP. Offered to provide a loaner pump for 2 weeks but mother prefers to use manual pump due to limited to financial resources and personal preference. Informed that Lactation is available to support her as needed. Mother has all tubing for pumping. Mom made aware of O/P services, breastfeeding support groups, community resources, and our phone # for post-discharge questions.   Maternal Data Formula Feeding for Exclusion: No Has patient been taught Hand Expression?: Yes Does the patient have breastfeeding experience prior to this delivery?: Yes  Feeding    LATCH Score/Interventions                      Lactation Tools Discussed/Used     Consult Status      Christella Hartigan M 05/09/2015, 11:09 AM

## 2015-05-09 NOTE — Discharge Instructions (Signed)

## 2015-05-09 NOTE — Discharge Summary (Signed)
Obstetric Discharge Summary Reason for Admission: cesarean section Prenatal Procedures: ultrasound Intrapartum Procedures: cesarean: low cervical, transverse Postpartum Procedures: none Complications-Operative and Postpartum: none HEMOGLOBIN  Date Value Ref Range Status  05/07/2015 8.1* 12.0 - 15.0 g/dL Final    Comment:    DELTA CHECK NOTED REPEATED TO VERIFY   10/28/2014 12.9 g/dL Final   HCT  Date Value Ref Range Status  05/07/2015 24.3* 36.0 - 46.0 % Final  10/28/2014 39 % Final   Hospital Course:  24 yo [id number] delivered via c-section at 36 weeks due to IUGR.  Pt's course has been uncomplicated.  Patient being discharged POD#3 to home.  Precautions given regarding wound.  While the patient is on subutex, the patient continues to have pain.  I think that it is reasonable to discharge her with a limited amount of percocet - #12 given.  Physical Exam:  General: alert, cooperative and no distress Lochia: appropriate Uterine Fundus: firm Incision: healing well, no significant drainage, no dehiscence, no significant erythema DVT Evaluation: No evidence of DVT seen on physical exam. Negative Homan's sign.  Discharge Diagnoses: s/p cesarean section  Discharge Information: Date: 05/09/2015 Activity: pelvic rest Diet: routine Medications: PNV, Ibuprofen, Percocet and subutex Condition: stable Instructions: refer to practice specific booklet Discharge to: home Follow-up Information    Follow up with [facility] In 5 weeks.   Specialty:  Obstetrics and Gynecology   Contact information:   [address] [place] [address] [phone]      Newborn Data: Live born female  Birth Weight: 4 lb 2 oz (1870 g) APGAR: 9, 9   NICU.  Diahann Guajardo JEHIEL 05/09/2015, 8:00 AM

## 2015-05-09 NOTE — Progress Notes (Signed)
Discharge teaching complete. Pt understood all information and did not have any questions. Pt ambulated out of the hospital and discharged home to family.  

## 2015-05-13 ENCOUNTER — Ambulatory Visit (HOSPITAL_COMMUNITY): Payer: Self-pay

## 2015-05-20 ENCOUNTER — Ambulatory Visit (HOSPITAL_COMMUNITY): Payer: Self-pay

## 2015-05-27 ENCOUNTER — Ambulatory Visit (HOSPITAL_COMMUNITY): Payer: Self-pay

## 2022-08-22 ENCOUNTER — Other Ambulatory Visit: Payer: Self-pay

## 2022-08-22 ENCOUNTER — Emergency Department (HOSPITAL_COMMUNITY)
Admission: EM | Admit: 2022-08-22 | Discharge: 2022-08-23 | Disposition: A | Discharge status: Home / Self Care | Payer: Self-pay | Attending: Emergency Medicine | Admitting: Emergency Medicine

## 2022-08-22 ENCOUNTER — Encounter (HOSPITAL_COMMUNITY): Payer: Self-pay | Admitting: *Deleted

## 2022-08-22 DIAGNOSIS — Z7982 Long term (current) use of aspirin: Secondary | ICD-10-CM | POA: Insufficient documentation

## 2022-08-22 DIAGNOSIS — Z8719 Personal history of other diseases of the digestive system: Secondary | ICD-10-CM | POA: Insufficient documentation

## 2022-08-22 DIAGNOSIS — R1011 Right upper quadrant pain: Secondary | ICD-10-CM | POA: Insufficient documentation

## 2022-08-22 DIAGNOSIS — R112 Nausea with vomiting, unspecified: Secondary | ICD-10-CM | POA: Insufficient documentation

## 2022-08-22 DIAGNOSIS — R1013 Epigastric pain: Secondary | ICD-10-CM | POA: Insufficient documentation

## 2022-08-22 LAB — CBC
HCT: 39.1 % (ref 36.0–46.0)
Hemoglobin: 13.2 g/dL (ref 12.0–15.0)
MCH: 30.8 pg (ref 26.0–34.0)
MCHC: 33.8 g/dL (ref 30.0–36.0)
MCV: 91.1 fL (ref 80.0–100.0)
Platelets: 291 10*3/uL (ref 150–400)
RBC: 4.29 MIL/uL (ref 3.87–5.11)
RDW: 14.3 % (ref 11.5–15.5)
WBC: 8.1 10*3/uL (ref 4.0–10.5)
nRBC: 0 % (ref 0.0–0.2)

## 2022-08-22 NOTE — ED Notes (Addendum)
Pt refused iv and blood draw until EDP has seen her.

## 2022-08-22 NOTE — ED Provider Notes (Signed)
[facility] EMERGENCY DEPARTMENT Provider Note   CSN: [id number] Arrival date & time: 08/22/22  2135    History  Chief Complaint  Patient presents with   Abdominal Pain    Crystal Huang is a 31 y.o. female.  Patient is a 31 year old female with history of cholelithiasis diagnosed in 2021.  Patient presenting today with complaints of abdominal pain.  She describes discomfort to the epigastric area and right upper quadrant that started this morning.  Pain lasted for approximately 4 hours then seemed to resolve.  The pain returned and has been crampy and intermittent since.  She has felt nauseated and vomited once this morning.  She denies any fevers or chills.  She denies any diarrhea or constipation.  She denies urinary complaints.  The history is provided by the patient.       Home Medications Prior to Admission medications   Medication Sig Start Date End Date Taking? Authorizing Provider  acetaminophen (TYLENOL) 500 MG tablet Take 1,000 mg by mouth every 6 (six) hours as needed for headache.    [provider]  aspirin-acetaminophen-caffeine (EXCEDRIN MIGRAINE) [phone] MG per tablet Take 2 tablets by mouth every 6 (six) hours as needed for headache.     [provider]  buprenorphine (SUBUTEX) 8 MG SUBL SL tablet Place 8 mg under the tongue 3 (three) times daily.    [provider]  calcium carbonate (TUMS - DOSED IN MG ELEMENTAL CALCIUM) 500 MG chewable tablet Chew 1 tablet by mouth 2 (two) times daily as needed for indigestion or heartburn.    [provider]  ibuprofen (ADVIL,MOTRIN) 600 MG tablet Take 1 tablet (600 mg total) by mouth every 6 (six) hours. 05/09/15   Levie Heritage, DO  oxyCODONE-acetaminophen (ROXICET) 5-325 MG per tablet Take 1-2 tablets by mouth every 4 (four) hours as needed for severe pain. 05/09/15   Levie Heritage, DO  Prenatal Vit-Fe Fumarate-FA (PRENATAL VITAMIN PO) Take by mouth.    [provider]       Allergies    Tape    Review of Systems   Review of Systems  All other systems reviewed and are negative.   Physical Exam Updated Vital Signs BP (!) 137/101 (BP Location: Right Arm)   Pulse 100   Temp 98.1 F (36.7 C) (Oral)   Resp 20   Ht 5\' 7"  (1.702 m)   Wt 70.3 kg   LMP  (LMP Unknown)   SpO2 100%   BMI 24.28 kg/m  Physical Exam Vitals and nursing note reviewed.  Constitutional:      General: She is not in acute distress.    Appearance: She is well-developed. She is not diaphoretic.  HENT:     Head: Normocephalic and atraumatic.  Cardiovascular:     Rate and Rhythm: Normal rate and regular rhythm.     Heart sounds: No murmur heard.    No friction rub. No gallop.  Pulmonary:     Effort: Pulmonary effort is normal. No respiratory distress.     Breath sounds: Normal breath sounds. No wheezing.  Abdominal:     General: Bowel sounds are normal. There is no distension.     Palpations: Abdomen is soft.     Tenderness: There is abdominal tenderness in the right upper quadrant and epigastric area. There is no right CVA tenderness, left CVA tenderness, guarding or rebound.  Musculoskeletal:        General: Normal range of motion.  Cervical back: Normal range of motion and neck supple.  Skin:    General: Skin is warm and dry.  Neurological:     General: No focal deficit present.     Mental Status: She is alert and oriented to person, place, and time.     ED Results / Procedures / Treatments   Labs (all labs ordered are listed, but only abnormal results are displayed) Labs Reviewed  LIPASE, BLOOD  COMPREHENSIVE METABOLIC PANEL  CBC  URINALYSIS, ROUTINE W REFLEX MICROSCOPIC  POC URINE PREG, ED    EKG None  Radiology No results found.  Procedures Procedures  {Document cardiac monitor, telemetry assessment procedure when appropriate:1}  Medications Ordered in ED Medications - No data to display  ED Course/ Medical Decision Making/ A&P                            Medical Decision Making Amount and/or Complexity of Data Reviewed Labs: ordered.   ***  {Document critical care time when appropriate:1} {Document review of labs and clinical decision tools ie heart score, Chads2Vasc2 etc:1}  {Document your independent review of radiology images, and any outside records:1} {Document your discussion with family members, caretakers, and with consultants:1} {Document social determinants of health affecting pt's care:1} {Document your decision making why or why not admission, treatments were needed:1} Final Clinical Impression(s) / ED Diagnoses Final diagnoses:  None    Rx / DC Orders ED Discharge Orders     None

## 2022-08-22 NOTE — ED Triage Notes (Signed)
Pt with c/o abd pain since 1130 this morning. Constant x 4 hours, resolved and now intermittent. Emesis x 1 this morning.  Nausea intermittent.

## 2022-08-23 ENCOUNTER — Telehealth (HOSPITAL_COMMUNITY): Payer: Self-pay | Admitting: Nurse Practitioner

## 2022-08-23 ENCOUNTER — Ambulatory Visit (HOSPITAL_COMMUNITY)
Admission: RE | Admit: 2022-08-23 | Discharge: 2022-08-23 | Disposition: A | Discharge status: Home / Self Care | Payer: Self-pay | Source: Ambulatory Visit | Attending: Emergency Medicine | Admitting: Emergency Medicine

## 2022-08-23 DIAGNOSIS — R1013 Epigastric pain: Secondary | ICD-10-CM | POA: Insufficient documentation

## 2022-08-23 LAB — COMPREHENSIVE METABOLIC PANEL
ALT: 16 U/L (ref 0–44)
AST: 22 U/L (ref 15–41)
Albumin: 3.8 g/dL (ref 3.5–5.0)
Alkaline Phosphatase: 96 U/L (ref 38–126)
Anion gap: 7 (ref 5–15)
BUN: 9 mg/dL (ref 6–20)
CO2: 26 mmol/L (ref 22–32)
Calcium: 8.8 mg/dL — ABNORMAL LOW (ref 8.9–10.3)
Chloride: 104 mmol/L (ref 98–111)
Creatinine, Ser: 0.68 mg/dL (ref 0.44–1.00)
GFR, Estimated: >60 mL/min (ref >60)
Glucose, Bld: 83 mg/dL (ref 70–99)
Potassium: 3.7 mmol/L (ref 3.5–5.1)
Sodium: 137 mmol/L (ref 135–145)
Total Bilirubin: 0.3 mg/dL (ref 0.3–1.2)
Total Protein: 7.2 g/dL (ref 6.5–8.1)

## 2022-08-23 LAB — URINALYSIS, ROUTINE W REFLEX MICROSCOPIC
Bacteria, UA: NONE SEEN
Bilirubin Urine: NEGATIVE
Glucose, UA: NEGATIVE mg/dL
Hgb urine dipstick: NEGATIVE
Ketones, ur: NEGATIVE mg/dL
Nitrite: NEGATIVE
Protein, ur: NEGATIVE mg/dL
Specific Gravity, Urine: 1.017 (ref 1.005–1.030)
pH: 7 (ref 5.0–8.0)

## 2022-08-23 LAB — LIPASE, BLOOD: Lipase: 46 U/L (ref 11–51)

## 2022-08-23 LAB — POC URINE PREG, ED: Preg Test, Ur: NEGATIVE

## 2022-08-23 MED ORDER — HYDROCODONE-ACETAMINOPHEN 5-325 MG PO TABS: 2.0000 | ORAL_TABLET | Freq: Once | ORAL | Status: DC

## 2022-08-23 MED ORDER — HYDROCODONE-ACETAMINOPHEN 5-325 MG PO TABS: 1.0000 | ORAL_TABLET | Freq: Four times a day (QID) | ORAL | 0 refills | Status: DC | PRN

## 2022-08-23 NOTE — Discharge Instructions (Signed)
Take hydrocodone as prescribed as needed for pain.  Return tomorrow for an ultrasound of your gallbladder to further evaluate the cause of your pain.  Return to the emergency department if you develop worsening pain, high fever, bloody stool or vomit, or for other new and concerning symptoms.

## 2022-08-23 NOTE — Telephone Encounter (Signed)
Patient returned today for RUQ U/S.  Multiple gallstones. No indication of acute cholecystitis.  Patient has not yet picked up her prescription received last night for pain medication.  Results reviewed with patient. Questions answered.    Results for orders placed or performed during the hospital encounter of 08/22/22  Lipase, blood  Result Value Ref Range   Lipase 46 11 - 51 U/L  Comprehensive metabolic panel  Result Value Ref Range   Sodium 137 135 - 145 mmol/L   Potassium 3.7 3.5 - 5.1 mmol/L   Chloride 104 98 - 111 mmol/L   CO2 26 22 - 32 mmol/L   Glucose, Bld 83 70 - 99 mg/dL   BUN 9 6 - 20 mg/dL   Creatinine, Ser 2.95 0.44 - 1.00 mg/dL   Calcium 8.8 (L) 8.9 - 10.3 mg/dL   Total Protein 7.2 6.5 - 8.1 g/dL   Albumin 3.8 3.5 - 5.0 g/dL   AST 22 15 - 41 U/L   ALT 16 0 - 44 U/L   Alkaline Phosphatase 96 38 - 126 U/L   Total Bilirubin 0.3 0.3 - 1.2 mg/dL   GFR, Estimated >28 >41 mL/min   Anion gap 7 5 - 15  CBC  Result Value Ref Range   WBC 8.1 4.0 - 10.5 K/uL   RBC 4.29 3.87 - 5.11 MIL/uL   Hemoglobin 13.2 12.0 - 15.0 g/dL   HCT 32.4 40.1 - 02.7 %   MCV 91.1 80.0 - 100.0 fL   MCH 30.8 26.0 - 34.0 pg   MCHC 33.8 30.0 - 36.0 g/dL   RDW 25.3 66.4 - 40.3 %   Platelets 291 150 - 400 K/uL   nRBC 0.0 0.0 - 0.2 %  Urinalysis, Routine w reflex microscopic Urine, Clean Catch  Result Value Ref Range   Color, Urine YELLOW YELLOW   APPearance CLEAR CLEAR   Specific Gravity, Urine 1.017 1.005 - 1.030   pH 7.0 5.0 - 8.0   Glucose, UA NEGATIVE NEGATIVE mg/dL   Hgb urine dipstick NEGATIVE NEGATIVE   Bilirubin Urine NEGATIVE NEGATIVE   Ketones, ur NEGATIVE NEGATIVE mg/dL   Protein, ur NEGATIVE NEGATIVE mg/dL   Nitrite NEGATIVE NEGATIVE   Leukocytes,Ua TRACE (A) NEGATIVE   RBC / HPF 0-5 0 - 5 RBC/hpf   WBC, UA 0-5 0 - 5 WBC/hpf   Bacteria, UA NONE SEEN NONE SEEN   Squamous Epithelial / LPF 0-5 0 - 5   Mucus PRESENT   POC urine preg, ED  Result Value Ref Range   Preg  Test, Ur NEGATIVE NEGATIVE   US ABDOMEN LIMITED RUQ (LIVER/GB)  Result Date: 08/23/2022 CLINICAL DATA:  Epigastric pain EXAM: ULTRASOUND ABDOMEN LIMITED RIGHT UPPER QUADRANT COMPARISON:  None Available. FINDINGS: Gallbladder: There are multiple mobile intraluminal gallstones, largest measuring up to 1.2 cm. There is mild wall thickening. Negative sonographic Murphy sign. The gallbladder is nondilated. Common bile duct: Diameter: 5.9 mm, within normal limits. No intrahepatic ductal dilation. Liver: No focal lesion identified. Within normal limits in parenchymal echogenicity. Portal vein is patent on color Doppler imaging with normal direction of blood flow towards the liver. Other: None. IMPRESSION: Cholelithiasis with multiple intraluminal gallstones, largest measuring up to 1.2 cm. No evidence of acute cholecystitis. Electronically Signed   By: Caprice Renshaw M.D.   On: 08/23/2022 10:03

## 2022-09-06 ENCOUNTER — Ambulatory Visit: Payer: Self-pay | Admitting: Surgery

## 2022-09-27 ENCOUNTER — Ambulatory Visit (INDEPENDENT_AMBULATORY_CARE_PROVIDER_SITE_OTHER): Payer: Self-pay | Admitting: Surgery

## 2022-09-27 ENCOUNTER — Encounter: Payer: Self-pay | Admitting: Surgery

## 2022-09-27 VITALS — BP 120/79 | HR 91 | Temp 98.1°F | Resp 14 | Ht 67.0 in | Wt 147.0 lb

## 2022-09-27 DIAGNOSIS — K8020 Calculus of gallbladder without cholecystitis without obstruction: Secondary | ICD-10-CM

## 2022-09-27 NOTE — Progress Notes (Signed)
[facility] History and Physical  Reason for Referral:*** Referring Physician: ***  Chief Complaint   New Patient (Initial Visit)     Crystal Huang is a 31 y.o. female.  HPI: ***.  The *** started *** and has had a duration of ***.  It is associated with ***.  The *** is improved with ***, and is made worse with ***.    Quality*** Context***  Past Medical History:  Diagnosis Date  . Anemia    has had but resoved   . Drug abuse (HCC)   . GERD (gastroesophageal reflux disease)     Past Surgical History:  Procedure Laterality Date  . CESAREAN SECTION N/A 05/06/2015   Procedure: CESAREAN SECTION;  Surgeon: Tereso Newcomer, MD;  Location: [facility];  Service: Obstetrics;  Laterality: N/A;  Requested 05/06/15 @ 10:30a  . MOUTH SURGERY    . WISDOM TOOTH EXTRACTION      Family History  Problem Relation Age of Onset  . Heart disease Mother   . Kidney disease Mother   . Cancer Maternal Grandmother     Social History   Tobacco Use  . Smoking status: Every Day    Packs/day: 0.25    Years: 10.00    Total pack years: 2.50    Types: Cigarettes  . Smokeless tobacco: Never  Substance Use Topics  . Alcohol use: No  . Drug use: No    Comment: hx narcotic use, taking suboxone    Medications: {medication reviewed/display:3041432} Allergies as of 09/27/2022       Reactions   Tape Other (See Comments)   blisters        Medication List        Accurate as of September 27, 2022  2:49 PM. If you have any questions, ask your nurse or doctor.          STOP taking these medications    aspirin-acetaminophen-caffeine 250-250-65 MG tablet Commonly known as: EXCEDRIN MIGRAINE Stopped by: Landrum Carbonell A Donnamarie Shankles, DO   buprenorphine 8 MG Subl SL tablet Commonly known as: SUBUTEX Stopped by: Arla Boutwell A Hal Norrington, DO   calcium carbonate 500 MG chewable tablet Commonly known as: TUMS - dosed in mg elemental calcium Stopped by: Jazae Gandolfi A Azlaan Isidore,  DO   HYDROcodone-acetaminophen 5-325 MG tablet Commonly known as: Norco Stopped by: Rylon Poitra A Preslee Regas, DO   oxyCODONE-acetaminophen 5-325 MG tablet Commonly known as: Roxicet Stopped by: Leronda Lewers A Reice Bienvenue, DO   PRENATAL VITAMIN PO Stopped by: Erial Fikes A Denny Lave, DO       TAKE these medications    acetaminophen 500 MG tablet Commonly known as: TYLENOL Take 1,000 mg by mouth every 6 (six) hours as needed for headache.   ibuprofen 600 MG tablet Commonly known as: ADVIL Take 1 tablet (600 mg total) by mouth every 6 (six) hours.         ROS:  {Review of Systems:30496}  Blood pressure 120/79, pulse 91, temperature 98.1 F (36.7 C), temperature source Oral, resp. rate 14, height 5\' 7"  (1.702 m), weight 147 lb (66.7 kg), SpO2 98 %, unknown if currently breastfeeding. Physical Exam  Results: No results found for this or any previous visit (from the past 48 hour(s)).  No results found.   Assessment & Plan:  Crystal Huang is a 31 y.o. female with *** -*** -*** -Follow up ***  All questions were answered to the satisfaction of the patient and family***.  The risk and benefits of *** were discussed including but  not limited to ***.  After careful consideration, Crystal Huang has decided to ***.    Yeslin Delio A Brigett Estell 09/27/2022, 2:49 PM   Theophilus Kinds, DO [facility] [address] [place], [place] [address] [phone] (office)

## 2022-11-07 ENCOUNTER — Encounter (HOSPITAL_COMMUNITY): Payer: Self-pay

## 2022-11-09 ENCOUNTER — Ambulatory Visit (HOSPITAL_COMMUNITY): Admission: RE | Admit: 2022-11-09 | Payer: Self-pay | Source: Home / Self Care | Admitting: Surgery

## 2022-11-09 ENCOUNTER — Encounter (HOSPITAL_COMMUNITY): Admission: RE | Payer: Self-pay | Source: Home / Self Care

## 2022-11-09 SURGERY — CHOLECYSTECTOMY, ROBOT-ASSISTED, LAPAROSCOPIC
Anesthesia: General
